# Patient Record
Sex: Male | Born: 2000 | Race: Black or African American | Hispanic: No | Marital: Single | Smoking: Never smoker
Health system: Southern US, Community
[De-identification: ages and names within clinical notes are randomized; demographics above are authoritative.]

## PROBLEM LIST (undated history)

## (undated) DIAGNOSIS — J45909 Unspecified asthma, uncomplicated: Secondary | ICD-10-CM

## (undated) DIAGNOSIS — R011 Cardiac murmur, unspecified: Secondary | ICD-10-CM

---
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
## 2021-09-23 ENCOUNTER — Encounter: Payer: Self-pay | Admitting: Emergency Medicine

## 2021-09-23 ENCOUNTER — Emergency Department: Payer: Medicaid Other

## 2021-09-23 ENCOUNTER — Emergency Department
Admission: EM | Admit: 2021-09-23 | Discharge: 2021-09-23 | Disposition: A | Discharge status: Home / Self Care | Payer: Medicaid Other | Attending: Emergency Medicine | Admitting: Emergency Medicine

## 2021-09-23 ENCOUNTER — Other Ambulatory Visit: Payer: Self-pay

## 2021-09-23 DIAGNOSIS — R0981 Nasal congestion: Secondary | ICD-10-CM | POA: Insufficient documentation

## 2021-09-23 DIAGNOSIS — Z20822 Contact with and (suspected) exposure to covid-19: Secondary | ICD-10-CM | POA: Diagnosis not present

## 2021-09-23 DIAGNOSIS — R519 Headache, unspecified: Secondary | ICD-10-CM | POA: Diagnosis present

## 2021-09-23 DIAGNOSIS — R059 Cough, unspecified: Secondary | ICD-10-CM | POA: Insufficient documentation

## 2021-09-23 DIAGNOSIS — R0989 Other specified symptoms and signs involving the circulatory and respiratory systems: Secondary | ICD-10-CM | POA: Diagnosis not present

## 2021-09-23 DIAGNOSIS — Z5321 Procedure and treatment not carried out due to patient leaving prior to being seen by health care provider: Secondary | ICD-10-CM | POA: Insufficient documentation

## 2021-09-23 LAB — RESP PANEL BY RT-PCR (FLU A&B, COVID) ARPGX2
Influenza A by PCR: NEGATIVE
Influenza B by PCR: NEGATIVE
SARS Coronavirus 2 by RT PCR: NEGATIVE

## 2021-09-23 NOTE — ED Triage Notes (Signed)
Pt arrived via [facility] from home with reports of HA, congestion and runny nose for several days.  Pt ambulatory on arrival, alert and oriented x 4.

## 2021-09-23 NOTE — ED Notes (Signed)
No answer when called from lobby 

## 2021-09-23 NOTE — ED Notes (Signed)
No answer when called several times from lobby 
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
## 2022-05-22 ENCOUNTER — Emergency Department: Payer: Medicaid Other

## 2022-05-22 ENCOUNTER — Other Ambulatory Visit: Payer: Self-pay

## 2022-05-22 ENCOUNTER — Emergency Department
Admission: EM | Admit: 2022-05-22 | Discharge: 2022-05-22 | Disposition: A | Discharge status: Home / Self Care | Payer: Medicaid Other | Attending: Emergency Medicine | Admitting: Emergency Medicine

## 2022-05-22 ENCOUNTER — Encounter: Payer: Self-pay | Admitting: Emergency Medicine

## 2022-05-22 DIAGNOSIS — J45909 Unspecified asthma, uncomplicated: Secondary | ICD-10-CM | POA: Insufficient documentation

## 2022-05-22 DIAGNOSIS — R519 Headache, unspecified: Secondary | ICD-10-CM | POA: Insufficient documentation

## 2022-05-22 DIAGNOSIS — S29001A Unspecified injury of muscle and tendon of front wall of thorax, initial encounter: Secondary | ICD-10-CM | POA: Insufficient documentation

## 2022-05-22 DIAGNOSIS — S299XXA Unspecified injury of thorax, initial encounter: Secondary | ICD-10-CM

## 2022-05-22 MED ORDER — IBUPROFEN 400 MG PO TABS
400.0000 mg | ORAL_TABLET | Freq: Once | ORAL | Status: AC
  Administered 2022-05-22: 400 mg via ORAL
  Filled 2022-05-22: qty 1
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
## 2022-06-10 DIAGNOSIS — B9689 Other specified bacterial agents as the cause of diseases classified elsewhere: Secondary | ICD-10-CM | POA: Diagnosis not present

## 2022-06-10 DIAGNOSIS — A64 Unspecified sexually transmitted disease: Secondary | ICD-10-CM | POA: Insufficient documentation

## 2022-06-10 DIAGNOSIS — K625 Hemorrhage of anus and rectum: Secondary | ICD-10-CM | POA: Diagnosis not present

## 2022-06-10 DIAGNOSIS — J45909 Unspecified asthma, uncomplicated: Secondary | ICD-10-CM | POA: Diagnosis not present

## 2022-06-10 DIAGNOSIS — N390 Urinary tract infection, site not specified: Secondary | ICD-10-CM | POA: Insufficient documentation

## 2022-06-10 DIAGNOSIS — R1084 Generalized abdominal pain: Secondary | ICD-10-CM | POA: Diagnosis present

## 2022-06-10 NOTE — ED Triage Notes (Addendum)
EMS brings pt in from home for c/o generalized abd pain x 2 wks with rectal bleeding, N/V

## 2022-06-11 ENCOUNTER — Other Ambulatory Visit: Payer: Self-pay

## 2022-06-11 ENCOUNTER — Emergency Department: Payer: Medicaid Other

## 2022-06-11 ENCOUNTER — Encounter: Payer: Self-pay | Admitting: Emergency Medicine

## 2022-06-11 ENCOUNTER — Emergency Department
Admission: EM | Admit: 2022-06-11 | Discharge: 2022-06-11 | Disposition: A | Discharge status: Home / Self Care | Payer: Medicaid Other | Attending: Emergency Medicine | Admitting: Emergency Medicine

## 2022-06-11 DIAGNOSIS — K625 Hemorrhage of anus and rectum: Secondary | ICD-10-CM

## 2022-06-11 DIAGNOSIS — N390 Urinary tract infection, site not specified: Secondary | ICD-10-CM

## 2022-06-11 DIAGNOSIS — A64 Unspecified sexually transmitted disease: Secondary | ICD-10-CM

## 2022-06-11 DIAGNOSIS — R1084 Generalized abdominal pain: Secondary | ICD-10-CM

## 2022-06-11 LAB — COMPREHENSIVE METABOLIC PANEL
ALT: 14 U/L (ref 0–44)
AST: 18 U/L (ref 15–41)
Albumin: 4.3 g/dL (ref 3.5–5.0)
Alkaline Phosphatase: 67 U/L (ref 38–126)
Anion gap: 6 (ref 5–15)
BUN: 12 mg/dL (ref 6–20)
CO2: 26 mmol/L (ref 22–32)
Calcium: 9 mg/dL (ref 8.9–10.3)
Chloride: 108 mmol/L (ref 98–111)
Creatinine, Ser: 1.07 mg/dL (ref 0.61–1.24)
GFR, Estimated: >60 mL/min (ref >60)
Glucose, Bld: 92 mg/dL (ref 70–99)
Potassium: 3.5 mmol/L (ref 3.5–5.1)
Sodium: 140 mmol/L (ref 135–145)
Total Bilirubin: 0.9 mg/dL (ref 0.3–1.2)
Total Protein: 7 g/dL (ref 6.5–8.1)

## 2022-06-11 LAB — URINALYSIS, ROUTINE W REFLEX MICROSCOPIC
Bilirubin Urine: NEGATIVE
Glucose, UA: NEGATIVE mg/dL
Hgb urine dipstick: NEGATIVE
Ketones, ur: NEGATIVE mg/dL
Nitrite: NEGATIVE
Protein, ur: NEGATIVE mg/dL
Specific Gravity, Urine: 1.023 (ref 1.005–1.030)
Squamous Epithelial / HPF: NONE SEEN (ref 0–5)
WBC, UA: >50 WBC/hpf — ABNORMAL HIGH (ref 0–5)
pH: 7 (ref 5.0–8.0)

## 2022-06-11 LAB — CBC WITH DIFFERENTIAL/PLATELET
Abs Immature Granulocytes: 0.02 10*3/uL (ref 0.00–0.07)
Basophils Absolute: 0.1 10*3/uL (ref 0.0–0.1)
Basophils Relative: 1 %
Eosinophils Absolute: 0.1 10*3/uL (ref 0.0–0.5)
Eosinophils Relative: 1 %
HCT: 43 % (ref 39.0–52.0)
Hemoglobin: 14.9 g/dL (ref 13.0–17.0)
Immature Granulocytes: 0 %
Lymphocytes Relative: 24 %
Lymphs Abs: 2.2 10*3/uL (ref 0.7–4.0)
MCH: 29.9 pg (ref 26.0–34.0)
MCHC: 34.7 g/dL (ref 30.0–36.0)
MCV: 86.3 fL (ref 80.0–100.0)
Monocytes Absolute: 0.6 10*3/uL (ref 0.1–1.0)
Monocytes Relative: 6 %
Neutro Abs: 6.4 10*3/uL (ref 1.7–7.7)
Neutrophils Relative %: 68 %
Platelets: 210 10*3/uL (ref 150–400)
RBC: 4.98 MIL/uL (ref 4.22–5.81)
RDW: 13.1 % (ref 11.5–15.5)
WBC: 9.3 10*3/uL (ref 4.0–10.5)
nRBC: 0 % (ref 0.0–0.2)

## 2022-06-11 LAB — LIPASE, BLOOD: Lipase: 36 U/L (ref 11–51)

## 2022-06-11 LAB — CHLAMYDIA/NGC RT PCR (ARMC ONLY)
Chlamydia Tr: DETECTED — AB
N gonorrhoeae: DETECTED — AB

## 2022-06-11 MED ORDER — MORPHINE SULFATE (PF) 4 MG/ML IV SOLN
4.0000 mg | Freq: Once | INTRAVENOUS | Status: AC
  Administered 2022-06-11: 4 mg via INTRAVENOUS
  Filled 2022-06-11: qty 1

## 2022-06-11 MED ORDER — SODIUM CHLORIDE 0.9 % IV SOLN
1.0000 g | Freq: Once | INTRAVENOUS | Status: AC
  Administered 2022-06-11: 1 g via INTRAVENOUS
  Filled 2022-06-11: qty 10

## 2022-06-11 MED ORDER — IOHEXOL 300 MG/ML  SOLN
100.0000 mL | Freq: Once | INTRAMUSCULAR | Status: AC | PRN
  Administered 2022-06-11: 100 mL via INTRAVENOUS

## 2022-06-11 MED ORDER — ONDANSETRON 4 MG PO TBDP: 4.0000 mg | ORAL_TABLET | Freq: Three times a day (TID) | ORAL | 0 refills | Status: DC | PRN

## 2022-06-11 MED ORDER — DICYCLOMINE HCL 20 MG PO TABS: 20.0000 mg | ORAL_TABLET | Freq: Four times a day (QID) | ORAL | 0 refills | Status: DC

## 2022-06-11 MED ORDER — ONDANSETRON HCL 4 MG/2ML IJ SOLN
4.0000 mg | Freq: Once | INTRAMUSCULAR | Status: AC
  Administered 2022-06-11: 4 mg via INTRAVENOUS
  Filled 2022-06-11: qty 2

## 2022-06-11 MED ORDER — SODIUM CHLORIDE 0.9 % IV BOLUS
1000.0000 mL | Freq: Once | INTRAVENOUS | Status: AC
  Administered 2022-06-11: 1000 mL via INTRAVENOUS

## 2022-06-11 MED ORDER — DOXYCYCLINE HYCLATE 50 MG PO CAPS: 100.0000 mg | ORAL_CAPSULE | Freq: Two times a day (BID) | ORAL | 0 refills | Status: DC

## 2022-06-11 NOTE — ED Provider Notes (Signed)
[facility] Provider Note    Event Date/Time   First MD Initiated Contact with Patient 06/11/22 0158     (approximate)   History   Abdominal Pain   HPI  James Mclaughlin is a 21 y.o. male brought to the ED via EMS from home with a chief complaint of generalized abdominal pain x2 weeks with occasional nausea/vomiting and bright red blood per rectum.  Denies fever/chills, chest pain, shortness of breath, dysuria.  Sexually active, vaginal penetration; no anal penetration per patient or personally but no concerns for STDs.  Denies recent travel, antibiotic use or trauma.  Denies personal or family history of inflammatory bowel disease.     Past Medical History   Past Medical History:  Diagnosis Date   Asthma      Active Problem List  There are no problems to display for this patient.    Past Surgical History  History reviewed. No pertinent surgical history.   Home Medications   Prior to Admission medications   Medication Sig Start Date End Date Taking? Authorizing Provider  dicyclomine (BENTYL) 20 MG tablet Take 1 tablet (20 mg total) by mouth every 6 (six) hours. 06/11/22  Yes Irean Hong, MD  doxycycline (VIBRAMYCIN) 50 MG capsule Take 2 capsules (100 mg total) by mouth 2 (two) times daily. 06/11/22  Yes Irean Hong, MD  ondansetron (ZOFRAN-ODT) 4 MG disintegrating tablet Take 1 tablet (4 mg total) by mouth every 8 (eight) hours as needed for nausea or vomiting. 06/11/22  Yes Irean Hong, MD  ipratropium (ATROVENT) 0.06 % nasal spray Place 2 sprays into both nostrils 4 (four) times daily as needed for rhinitis. 04/08/20   Tommie Sams, DO  ketorolac (TORADOL) 10 MG tablet Take 1 tablet (10 mg total) by mouth every 6 (six) hours as needed for moderate pain or severe pain. 04/08/20   Tommie Sams, DO     Allergies  Patient has no known allergies.   Family History   Family History  Problem Relation Age of Onset   Healthy Mother    Healthy  Father      Physical Exam  Triage Vital Signs: ED Triage Vitals  Enc Vitals Group     BP 06/11/22 0005 130/70     Pulse Rate 06/11/22 0005 82     Resp 06/11/22 0005 18     Temp 06/11/22 0005 98.6 F (37 C)     Temp Source 06/11/22 0005 Oral     SpO2 06/11/22 0005 94 %     Weight 06/11/22 0003 185 lb (83.9 kg)     Height 06/11/22 0003 5\' 8"  (1.727 m)     Head Circumference --      Peak Flow --      Pain Score 06/11/22 0003 8     Pain Loc --      Pain Edu? --      Excl. in GC? --     Updated Vital Signs: BP 130/70 (BP Location: Right Arm)   Pulse 82   Temp 98.6 F (37 C) (Oral)   Resp 18   Ht 5\' 8"  (1.727 m)   Wt 83.9 kg   SpO2 94%   BMI 28.13 kg/m    General: Awake, no distress.  Texting on cell phone. CV:  RRR.  Good peripheral perfusion.  Resp:  Normal effort.  CTAB. Abd:  Mild generalized tenderness to palpation without rebound or guarding.  No distention.  CVAT.  No truncal vesicles. Other:  Yellow urethral discharge.  Circumcised male.  Bilaterally distended testicles which are nontender and nonswollen.  Strong bilateral cremasteric reflexes.   ED Results / Procedures / Treatments  Labs (all labs ordered are listed, but only abnormal results are displayed) Labs Reviewed  CHLAMYDIA/NGC RT PCR ([facility] ONLY)           - Abnormal; Notable for the following components:      Result Value   Chlamydia Tr DETECTED (*)    N gonorrhoeae DETECTED (*)    All other components within normal limits  URINALYSIS, ROUTINE W REFLEX MICROSCOPIC - Abnormal; Notable for the following components:   Color, Urine YELLOW (*)    APPearance CLOUDY (*)    Leukocytes,Ua LARGE (*)    WBC, UA >50 (*)    Bacteria, UA RARE (*)    All other components within normal limits  URINE CULTURE  CBC WITH DIFFERENTIAL/PLATELET  COMPREHENSIVE METABOLIC PANEL  LIPASE, BLOOD     EKG  None   RADIOLOGY I have independently visualized and interpreted patient's CT scan as well as noted the  radiology interpretation:  CT abdomen/pelvis: No acute; right lobe liver hyperdense area  Official radiology report(s): CT Abdomen Pelvis W Contrast  Result Date: 06/11/2022 CLINICAL DATA:  Generalized abdominal pain and rectal bleeding. EXAM: CT ABDOMEN AND PELVIS WITH CONTRAST TECHNIQUE: Multidetector CT imaging of the abdomen and pelvis was performed using the standard protocol following bolus administration of intravenous contrast. RADIATION DOSE REDUCTION: This exam was performed according to the departmental dose-optimization program which includes automated exposure control, adjustment of the mA and/or kV according to patient size and/or use of iterative reconstruction technique. CONTRAST:  OMNIPAQUE IOHEXOL 300 MG/ML  SOLN COMPARISON:  None Available. FINDINGS: Lower chest: No acute abnormality. Hepatobiliary: An ill-defined, approximately 2.6 cm x 1.6 cm mildly hyperdense area is seen within the posterior aspect of the right lobe of the liver, adjacent to the gallbladder fossa. No gallstones, gallbladder wall thickening, or biliary dilatation. Pancreas: Unremarkable. No pancreatic ductal dilatation or surrounding inflammatory changes. Spleen: Normal in size without focal abnormality. Adrenals/Urinary Tract: Adrenal glands are unremarkable. Kidneys are normal, without renal calculi, focal lesion, or hydronephrosis. Bladder is unremarkable. Stomach/Bowel: Stomach is within normal limits. Appendix appears normal. No evidence of bowel wall thickening, distention, or inflammatory changes. Vascular/Lymphatic: No significant vascular findings are present. No enlarged abdominal or pelvic lymph nodes. Reproductive: Prostate is unremarkable. Other: No abdominal wall hernia or abnormality. No abdominopelvic ascites. Musculoskeletal: No acute or significant osseous findings. IMPRESSION: 1. Findings which may represent an area of focal fatty infiltration within the right lobe of the liver. Further  evaluation with nonemergent hepatic ultrasound is recommended. 2. No evidence of an acute or active process within the abdomen or pelvis. Electronically Signed   By: Aram Candela M.D.   On: 06/11/2022 02:51     PROCEDURES:  Critical Care performed: No  Procedures  Rectal: Deferred by patient  MEDICATIONS ORDERED IN ED: Medications  sodium chloride 0.9 % bolus 1,000 mL (1,000 mLs Intravenous New Bag/Given 06/11/22 0239)  ondansetron (ZOFRAN) injection 4 mg (4 mg Intravenous Given 06/11/22 0240)  morphine (PF) 4 MG/ML injection 4 mg (4 mg Intravenous Given 06/11/22 0240)  cefTRIAXone (ROCEPHIN) 1 g in sodium chloride 0.9 % 100 mL IVPB (1 g Intravenous New Bag/Given 06/11/22 0240)  iohexol (OMNIPAQUE) 300 MG/ML solution 100 mL (100 mLs Intravenous Contrast Given 06/11/22 0218)     IMPRESSION / MDM / ASSESSMENT  AND PLAN / ED COURSE  I reviewed the triage vital signs and the nursing notes.                             21 year old male presenting with abdominal pain, rectal bleeding, urethral discharge recently Differential diagnosis includes, but is not limited to, acute appendicitis, renal colic, testicular torsion, urinary tract infection/pyelonephritis, prostatitis,  epididymitis, diverticulitis, small bowel obstruction or ileus, colitis, abdominal aortic aneurysm, gastroenteritis, hernia, etc. I have personally reviewed patient's records and notes mainly the ED and urgent care visits for the past 2 years.  Patient's presentation is most consistent with acute illness / injury with system symptoms.  Laboratory results demonstrate normal WBC 9.3, normal electrolytes.  UA demonstrates large leukocytes with greater than 50 WBCs.  We will add urine culture and STI.  Obtain CT abdomen/pelvis to evaluate etiology of patient's symptoms.  Keep NPO, initiate IV fluid hydration, IV morphine for pain paired with IV Zofran to prevent nausea.  Initiate 1 g IV Rocephin to cover gonorrhea as well as  UTI.  Will reassess.  Clinical Course as of 06/11/22 0543  Caleen Essex Jun 11, 2022  3299 CT demonstrates no acute process.  Will place on doxycycline for UTI which will also cover possible chlamydia.  Discharged home Bentyl and Zofran to use as needed; patient will follow-up GI as an outpatient; he can also follow-up with GI for abnormal liver lobe seen on CT.  Strict return precautions given.  Patient verbalizes understanding agrees with plan of care. [JS]  0542 Patient is positive for both chlamydia and gonorrhea.  Encouraged to tell his sexual partner to seek treatment at the health department.  [JS]    Clinical Course User Index [JS] Irean Hong, MD     FINAL CLINICAL IMPRESSION(S) / ED DIAGNOSES   Final diagnoses:  Generalized abdominal pain  Lower urinary tract infectious disease  Rectal bleed  STD (sexually transmitted disease)     Rx / DC Orders   ED Discharge Orders          Ordered    doxycycline (VIBRAMYCIN) 50 MG capsule  2 times daily        06/11/22 0259    dicyclomine (BENTYL) 20 MG tablet  Every 6 hours        06/11/22 0259    ondansetron (ZOFRAN-ODT) 4 MG disintegrating tablet  Every 8 hours PRN        06/11/22 0259             Note:  This document was prepared using Dragon voice recognition software and may include unintentional dictation errors.   Irean Hong, MD 06/11/22 [phone]

## 2022-06-11 NOTE — Discharge Instructions (Addendum)
1.  Take antibiotic as prescribed (Doxycycline 100mg  twice daily x7 days). 2.  You may take medicines as needed for abdominal discomfort and nausea (Bentyl/Zofran #20). 3.  You tested positive for both chlamydia and gonorrhea.  Please inform your sexual partner(s) to seek treatment at the health department. 4.  Return to the ER for worsening symptoms, persistent vomiting, difficulty breathing or other concerns.

## 2022-06-12 LAB — URINE CULTURE: Culture: NO GROWTH

## 2022-06-17 ENCOUNTER — Emergency Department (HOSPITAL_COMMUNITY): Payer: Medicaid Other

## 2022-06-17 ENCOUNTER — Emergency Department (HOSPITAL_COMMUNITY)
Admission: EM | Admit: 2022-06-17 | Discharge: 2022-06-17 | Disposition: A | Discharge status: Home / Self Care | Payer: Medicaid Other | Attending: Emergency Medicine | Admitting: Emergency Medicine

## 2022-06-17 ENCOUNTER — Encounter (HOSPITAL_COMMUNITY): Payer: Self-pay | Admitting: Emergency Medicine

## 2022-06-17 ENCOUNTER — Other Ambulatory Visit: Payer: Self-pay

## 2022-06-17 DIAGNOSIS — R079 Chest pain, unspecified: Secondary | ICD-10-CM | POA: Insufficient documentation

## 2022-06-17 DIAGNOSIS — K921 Melena: Secondary | ICD-10-CM | POA: Diagnosis not present

## 2022-06-17 LAB — CBC WITH DIFFERENTIAL/PLATELET
Abs Immature Granulocytes: 0.02 10*3/uL (ref 0.00–0.07)
Basophils Absolute: 0.1 10*3/uL (ref 0.0–0.1)
Basophils Relative: 1 %
Eosinophils Absolute: 0.1 10*3/uL (ref 0.0–0.5)
Eosinophils Relative: 1 %
HCT: 48.9 % (ref 39.0–52.0)
Hemoglobin: 16.3 g/dL (ref 13.0–17.0)
Immature Granulocytes: 0 %
Lymphocytes Relative: 29 %
Lymphs Abs: 2.1 10*3/uL (ref 0.7–4.0)
MCH: 30.1 pg (ref 26.0–34.0)
MCHC: 33.3 g/dL (ref 30.0–36.0)
MCV: 90.4 fL (ref 80.0–100.0)
Monocytes Absolute: 0.4 10*3/uL (ref 0.1–1.0)
Monocytes Relative: 5 %
Neutro Abs: 4.7 10*3/uL (ref 1.7–7.7)
Neutrophils Relative %: 64 %
Platelets: 246 10*3/uL (ref 150–400)
RBC: 5.41 MIL/uL (ref 4.22–5.81)
RDW: 13.1 % (ref 11.5–15.5)
WBC: 7.3 10*3/uL (ref 4.0–10.5)
nRBC: 0 % (ref 0.0–0.2)

## 2022-06-17 LAB — COMPREHENSIVE METABOLIC PANEL
ALT: 18 U/L (ref 0–44)
AST: 18 U/L (ref 15–41)
Albumin: 4.3 g/dL (ref 3.5–5.0)
Alkaline Phosphatase: 62 U/L (ref 38–126)
Anion gap: 4 — ABNORMAL LOW (ref 5–15)
BUN: 11 mg/dL (ref 6–20)
CO2: 30 mmol/L (ref 22–32)
Calcium: 9.5 mg/dL (ref 8.9–10.3)
Chloride: 105 mmol/L (ref 98–111)
Creatinine, Ser: 1.07 mg/dL (ref 0.61–1.24)
GFR, Estimated: >60 mL/min (ref >60)
Glucose, Bld: 107 mg/dL — ABNORMAL HIGH (ref 70–99)
Potassium: 3.8 mmol/L (ref 3.5–5.1)
Sodium: 139 mmol/L (ref 135–145)
Total Bilirubin: 0.8 mg/dL (ref 0.3–1.2)
Total Protein: 7.2 g/dL (ref 6.5–8.1)

## 2022-06-17 LAB — LIPASE, BLOOD: Lipase: 44 U/L (ref 11–51)

## 2022-06-17 MED ORDER — ALBUTEROL SULFATE HFA 108 (90 BASE) MCG/ACT IN AERS
2.0000 | INHALATION_SPRAY | Freq: Once | RESPIRATORY_TRACT | Status: AC
  Administered 2022-06-17: 2 via RESPIRATORY_TRACT
  Filled 2022-06-17: qty 6.7

## 2022-06-17 MED ORDER — DEXAMETHASONE 4 MG PO TABS
10.0000 mg | ORAL_TABLET | Freq: Once | ORAL | Status: AC
  Administered 2022-06-17: 10 mg via ORAL
  Filled 2022-06-17: qty 1

## 2022-06-17 NOTE — ED Triage Notes (Signed)
  Patient comes in with mid chest pain that has been going on for 2-3 days.  Patient states the pain stays in the middle of his chest and feels like he is being stabbed.  Patient states he does vape everyday.  Also endorses bright red blood in his stool and painful BMs.  Was seen a few days ago at [facility] and diagnosed with fatty liver.  Pain 7/10, stabbing in chest.

## 2022-06-17 NOTE — ED Provider Notes (Signed)
[facility]-EMERGENCY DEPT Provider Note   CSN: [id number] Arrival date & time: 06/17/22  1930     History  Chief Complaint  Patient presents with   Chest Pain   Rectal Bleeding    James Mclaughlin is a 21 y.o. male.  Patient here with chest pain last several days.  Does vape.  May be remote history of needing to use inhalers in the past.  Nothing makes it worse or better.  Denies any fevers or chills.  Has had a cough.  No significant medical history with the family.  No early heart disease in the family.  He is also has some blood intermixed in his stool with may be some hard bowel movements.  He was seen several days ago for the same and had a CT scan of his abdomen and pelvis.  He is not on any blood thinners.  He is not had any issues in the past with this.  Nothing makes it worse or better.  Denies any abdominal pain currently.  The history is provided by the patient.       Home Medications Prior to Admission medications   Medication Sig Start Date End Date Taking? Authorizing Provider  dicyclomine (BENTYL) 20 MG tablet Take 1 tablet (20 mg total) by mouth every 6 (six) hours. 06/11/22   Irean Hong, MD  doxycycline (VIBRAMYCIN) 50 MG capsule Take 2 capsules (100 mg total) by mouth 2 (two) times daily. 06/11/22   Irean Hong, MD  ipratropium (ATROVENT) 0.06 % nasal spray Place 2 sprays into both nostrils 4 (four) times daily as needed for rhinitis. 04/08/20   Tommie Sams, DO  ketorolac (TORADOL) 10 MG tablet Take 1 tablet (10 mg total) by mouth every 6 (six) hours as needed for moderate pain or severe pain. 04/08/20   Tommie Sams, DO  ondansetron (ZOFRAN-ODT) 4 MG disintegrating tablet Take 1 tablet (4 mg total) by mouth every 8 (eight) hours as needed for nausea or vomiting. 06/11/22   Irean Hong, MD      Allergies    Patient has no known allergies.    Review of Systems   Review of Systems  Physical Exam Updated Vital Signs BP 134/68   Pulse  67   Temp 98 F (36.7 C) (Oral)   Resp 18   Ht 5\' 8"  (1.727 m)   Wt 83.9 kg   SpO2 99%   BMI 28.13 kg/m  Physical Exam Vitals and nursing note reviewed.  Constitutional:      General: He is not in acute distress.    Appearance: He is well-developed. He is not ill-appearing.  HENT:     Head: Normocephalic and atraumatic.  Eyes:     Extraocular Movements: Extraocular movements intact.     Conjunctiva/sclera: Conjunctivae normal.     Pupils: Pupils are equal, round, and reactive to light.  Cardiovascular:     Rate and Rhythm: Normal rate and regular rhythm.     Pulses:          Radial pulses are 2+ on the right side and 2+ on the left side.     Heart sounds: Normal heart sounds. No murmur heard. Pulmonary:     Effort: Pulmonary effort is normal. No respiratory distress.     Breath sounds: Normal breath sounds. No decreased breath sounds or wheezing.  Abdominal:     Palpations: Abdomen is soft.     Tenderness: There is no abdominal tenderness.  Musculoskeletal:        General: No swelling.     Cervical back: Normal range of motion and neck supple.  Skin:    General: Skin is warm and dry.     Capillary Refill: Capillary refill takes less than 2 seconds.  Neurological:     General: No focal deficit present.     Mental Status: He is alert.  Psychiatric:        Mood and Affect: Mood normal.     ED Results / Procedures / Treatments   Labs (all labs ordered are listed, but only abnormal results are displayed) Labs Reviewed  COMPREHENSIVE METABOLIC PANEL - Abnormal; Notable for the following components:      Result Value   Glucose, Bld 107 (*)    Anion gap 4 (*)    All other components within normal limits  CBC WITH DIFFERENTIAL/PLATELET  LIPASE, BLOOD    EKG EKG Interpretation  Date/Time:  Thursday June 17 2022 20:50:11 EST Ventricular Rate:  68 PR Interval:  132 QRS Duration: 76 QT Interval:  342 QTC Calculation: 364 R Axis:   91 Text  Interpretation: Sinus rhythm Borderline right axis deviation Borderline T wave abnormalities Confirmed by Virgina Norfolk ([id number]) on 06/17/2022 9:56:23 PM  Radiology DG Chest Portable 1 View  Result Date: 06/17/2022 CLINICAL DATA:  Chest pain EXAM: PORTABLE CHEST 1 VIEW COMPARISON:  05/22/2022 FINDINGS: The heart size and mediastinal contours are within normal limits. Both lungs are clear. The visualized skeletal structures are unremarkable. IMPRESSION: No active disease. Electronically Signed   By: Jasmine Pang M.D.   On: 06/17/2022 20:18    Procedures Procedures    Medications Ordered in ED Medications  dexamethasone (DECADRON) tablet 10 mg (has no administration in time range)  albuterol (VENTOLIN HFA) 108 (90 Base) MCG/ACT inhaler 2 puff (has no administration in time range)    ED Course/ Medical Decision Making/ A&P                           Medical Decision Making Amount and/or Complexity of Data Reviewed Labs: ordered. Radiology: ordered.  Risk Prescription drug management.   James Mclaughlin is here with chest pain and blood in his stool.  Normal vitals.  No fever.  As far as chest pain differential diagnosis likely reactive airway process versus viral process.  He has no significant cardiac risk factors and doubt ACS.  EKG shows sinus rhythm.  No ischemic changes per my review and interpretation of EKG.  I have no concern for ACS and overall we will get chest x-ray.  He has had a cough.  Will give Decadron and albuterol and reevaluate.  As far as blood in the stool this seems to be a chronic issue.  We will get a CBC to check his hemoglobin and compare to several days ago.  CT scan done earlier this week and per my review was unremarkable.  Nonspecific may be fatty infiltration to the liver.  However her liver enzymes are normal.  Hemoglobin is unremarkable.  Lab work overall per my review and interpretation is unremarkable.  Sounds like he is likely has some constipation and  suspect that this is the cause of blood in the stool.  However we will have him follow-up with gastroenterology in case this is more of an inflammatory bowel process.  Vital signs are normal.  His abdominal exam is benign.  Chest x-ray per my review and interpretation showed no  evidence of pneumonia or pneumothorax.  Overall suspect reactive airway process secondary to vaping.  Encouraged him to stop vaping.  He feels better with breathing treatment and Decadron.  Recommend continued albuterol as needed.  We will have him follow-up with gastroenterology for blood in his stool.  Discharged in good condition.  Understands return precautions.  This chart was dictated using voice recognition software.  Despite best efforts to proofread,  errors can occur which can change the documentation meaning.         Final Clinical Impression(s) / ED Diagnoses Final diagnoses:  Chest pain, unspecified type  Blood in stool    Rx / DC Orders ED Discharge Orders     None         Virgina Norfolk, DO 06/17/22 2202

## 2022-06-17 NOTE — Discharge Instructions (Signed)
I have treated you with a long-acting steroid to help with your chest pain.  Use 2 puffs of albuterol inhaler every 4-6 hours as needed.  Follow-up with gastroenterology team.

## 2022-07-10 ENCOUNTER — Emergency Department (HOSPITAL_COMMUNITY): Payer: Medicaid Other

## 2022-07-10 ENCOUNTER — Emergency Department (HOSPITAL_COMMUNITY)
Admission: EM | Admit: 2022-07-10 | Discharge: 2022-07-10 | Disposition: A | Discharge status: Home / Self Care | Payer: Medicaid Other | Attending: Emergency Medicine | Admitting: Emergency Medicine

## 2022-07-10 ENCOUNTER — Encounter (HOSPITAL_COMMUNITY): Payer: Self-pay

## 2022-07-10 ENCOUNTER — Other Ambulatory Visit: Payer: Self-pay

## 2022-07-10 DIAGNOSIS — W108XXA Fall (on) (from) other stairs and steps, initial encounter: Secondary | ICD-10-CM | POA: Diagnosis not present

## 2022-07-10 DIAGNOSIS — W19XXXA Unspecified fall, initial encounter: Secondary | ICD-10-CM

## 2022-07-10 DIAGNOSIS — R519 Headache, unspecified: Secondary | ICD-10-CM | POA: Diagnosis present

## 2022-07-10 DIAGNOSIS — J45909 Unspecified asthma, uncomplicated: Secondary | ICD-10-CM | POA: Diagnosis not present

## 2022-07-10 DIAGNOSIS — Y92009 Unspecified place in unspecified non-institutional (private) residence as the place of occurrence of the external cause: Secondary | ICD-10-CM | POA: Diagnosis not present

## 2022-07-10 NOTE — ED Triage Notes (Addendum)
Pt states that he fell at home and hit his head on the back door at 0250. No swelling noted or bleeding noted. Pt states that he took 2 excedrin. Pt states that he only remembers falling down the stairs and that's it.

## 2022-07-10 NOTE — Discharge Instructions (Signed)
You were seen in the emergency department today after a fall.  The CT scan of your head and neck are both normal.  If you have ongoing symptoms that may be related to a concussion, please call of our sports medicine to follow-up with the concussion clinic.  I have also provided some reading in your discharge instructions about concussion.  Please return if you have confusion, slurred speech, passing out.

## 2022-07-10 NOTE — ED Provider Notes (Signed)
James Mclaughlin is a 21 y.o. male.  With past medical history of asthma who presents to the emergency department after a fall.  Patient states that around 2 AM he was downstairs when his daughter began crying.  He states that he went upstairs to check on her and then on his way back downstairs tripped and fell down 2-3 steps.  He states that he struck his head on the door at the bottom of the stairs.  He denies loss of consciousness.  Not anticoagulated.  He states that since then he has had minor headache.  He denies neck pain, vomiting, changes to his vision, confusion.    Fall Associated symptoms include headaches.       Home Medications Prior to Admission medications   Medication Sig Start Date End Date Taking? Authorizing Provider  dicyclomine (BENTYL) 20 MG tablet Take 1 tablet (20 mg total) by mouth every 6 (six) hours. 06/11/22   Irean Hong, MD  doxycycline (VIBRAMYCIN) 50 MG capsule Take 2 capsules (100 mg total) by mouth 2 (two) times daily. 06/11/22   Irean Hong, MD  ipratropium (ATROVENT) 0.06 % nasal spray Place 2 sprays into both nostrils 4 (four) times daily as needed for rhinitis. 04/08/20   Tommie Sams, DO  ketorolac (TORADOL) 10 MG tablet Take 1 tablet (10 mg total) by mouth every 6 (six) hours as needed for moderate pain or severe pain. 04/08/20   Tommie Sams, DO  ondansetron (ZOFRAN-ODT) 4 MG disintegrating tablet Take 1 tablet (4 mg total) by mouth every 8 (eight) hours as needed for nausea or vomiting. 06/11/22   Irean Hong, MD      Allergies    Patient has no known allergies.    Review of Systems   Review of Systems  Neurological:  Positive for headaches.  All other systems reviewed and are negative.   Physical Exam Updated Vital Signs BP 130/69 (BP Location: Left Arm)    Pulse 75   Temp 98 F (36.7 C) (Oral)   Resp 16   Ht 5\' 8"  (1.727 m)   Wt 86.2 kg   SpO2 94%   BMI 28.89 kg/m  Physical Exam Vitals and nursing note reviewed.  Constitutional:      General: He is not in acute distress.    Appearance: Normal appearance. He is not ill-appearing or toxic-appearing.  HENT:     Head: Normocephalic and atraumatic.  Eyes:     General: No scleral icterus. Pulmonary:     Effort: Pulmonary effort is normal. No respiratory distress.  Musculoskeletal:     Cervical back: Normal range of motion and neck supple. No tenderness.  Skin:    General: Skin is warm and dry.     Findings: No rash.  Neurological:     General: No focal deficit present.     Mental Status: He is alert and oriented to person, place, and time. Mental status is at baseline.  Psychiatric:        Mood and Affect: Mood normal.        Behavior: Behavior normal.        Thought Content: Thought content normal.        Judgment: Judgment normal.     ED Results / Procedures / Treatments  Labs (all labs ordered are listed, but only abnormal results are displayed) Labs Reviewed - No data to display  EKG None  Radiology CT Cervical Spine Wo Contrast  Result Date: 07/10/2022 CLINICAL DATA:  21 year old male status post fall striking head on door. EXAM: CT CERVICAL SPINE WITHOUT CONTRAST TECHNIQUE: Multidetector CT imaging of the cervical spine was performed without intravenous contrast. Multiplanar CT image reconstructions were also generated. RADIATION DOSE REDUCTION: This exam was performed according to the departmental dose-optimization program which includes automated exposure control, adjustment of the mA and/or kV according to patient size and/or use of iterative reconstruction technique. COMPARISON:  Head CT today. FINDINGS: Alignment: Straightening and mild reversal of cervical lordosis. Cervicothoracic junction alignment is within normal limits. Bilateral posterior element alignment  is within normal limits. Skull base and vertebrae: Nearing skeletal maturity. Bone mineralization is within normal limits. Visualized skull base is intact. No atlanto-occipital dissociation. C1 and C2 appear intact and aligned. No osseous abnormality identified. Soft tissues and spinal canal: No prevertebral fluid or swelling. No visible canal hematoma. Elongated bilateral stylohyoid ligament calcification. Otherwise negative visible noncontrast neck soft tissues. Disc levels:  Negative. Upper chest: Negative. IMPRESSION: 1. No acute traumatic injury identified in the cervical spine. 2. 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 Electronically Signed   By: Odessa Fleming M.D.   On: 07/10/2022 07:54   CT Head Wo Contrast  Result Date: 07/10/2022 CLINICAL DATA:  21 year old male status post fall striking head on door. EXAM: CT HEAD WITHOUT CONTRAST TECHNIQUE: Contiguous axial images were obtained from the base of the skull through the vertex without intravenous contrast. RADIATION DOSE REDUCTION: This exam was performed according to the departmental dose-optimization program which includes automated exposure control, adjustment of the mA and/or kV according to patient size and/or use of iterative reconstruction technique. COMPARISON:  Head CT 12/04/2019. FINDINGS: Brain: Normal cerebral volume. No midline shift, ventriculomegaly, mass effect, evidence of mass lesion, intracranial hemorrhage or evidence of cortically based acute infarction. Gray-white matter differentiation is within normal limits throughout the brain. Vascular: No suspicious intracranial vascular hyperdensity. Skull: Intact. Sinuses/Orbits: Visualized paranasal sinuses and mastoids are stable and well aerated. Other: No orbit or scalp soft tissue injury identified. IMPRESSION: Stable and normal noncontrast CT appearance of the brain. No acute traumatic injury identified. Electronically Signed   By: Odessa Fleming M.D.    On: 07/10/2022 07:51    Procedures Procedures   Medications Ordered in ED Medications - No data to display  ED Course/ Medical Decision Making/ A&P                           Medical Decision Making Amount and/or Complexity of Data Reviewed Radiology: ordered.  Initial Impression and Ddx 21 year old male who presents after mechanical fall down around 3 steps striking his head on the door frame.  His head is atraumatic.  No obvious injuries.  There is no C-spine tenderness to palpation.  He is alert and oriented with no focal neurological deficits.  Triage ordered a CT head and C-spine. Patient PMH that increases complexity of ED encounter: Asthma  Interpretation of Diagnostics I independent reviewed and interpreted the labs as followed: Not indicated  - I independently visualized the following imaging with scope of interpretation limited to determining acute life threatening conditions related to emergency care: CT head and C-spine, which revealed no acute abnormalities  Patient Reassessment and Ultimate Disposition/Management Patient with mechanical fall.  No  indication that this was a syncope requiring further workup.  No abnormalities on his physical exam.  Advanced imaging is negative.  No further workup at this time.  We discussed having follow-up with [facility] and other concussion clinic if he has any ongoing symptoms concerning for this.  He is agreeable to that.  No indication for medication management at this time.  Discharge  The patient has been appropriately medically screened and/or stabilized in the ED. I have low suspicion for any other emergent medical condition which would require further screening, evaluation or treatment in the ED or require inpatient management. At time of discharge the patient is hemodynamically stable and in no acute distress. I have discussed work-up results and diagnosis with patient and answered all questions. Patient is agreeable  with discharge plan. We discussed strict return precautions for returning to the emergency department and they verbalized understanding.     Patient management required discussion with the following services or consulting groups:  None  Complexity of Problems Addressed Acute complicated illness or Injury  Additional Data Reviewed and Analyzed Further history obtained from: Care Everywhere  Patient Encounter Risk Assessment None  Final Clinical Impression(s) / ED Diagnoses Final diagnoses:  Fall, initial encounter    Rx / DC Orders ED Discharge Orders     None         Cristopher Peru, PA-C 07/10/22 [id number]    Bethann Berkshire, MD 07/11/22 [phone]

## 2022-09-01 ENCOUNTER — Emergency Department
Admission: EM | Admit: 2022-09-01 | Discharge: 2022-09-02 | Disposition: A | Discharge status: Other Acute Inpatient Hospital | Payer: Medicaid Other | Attending: Emergency Medicine | Admitting: Emergency Medicine

## 2022-09-01 ENCOUNTER — Other Ambulatory Visit: Payer: Self-pay

## 2022-09-01 DIAGNOSIS — R45851 Suicidal ideations: Secondary | ICD-10-CM

## 2022-09-01 DIAGNOSIS — J45909 Unspecified asthma, uncomplicated: Secondary | ICD-10-CM | POA: Insufficient documentation

## 2022-09-01 DIAGNOSIS — Z1152 Encounter for screening for COVID-19: Secondary | ICD-10-CM | POA: Diagnosis not present

## 2022-09-01 DIAGNOSIS — F322 Major depressive disorder, single episode, severe without psychotic features: Secondary | ICD-10-CM | POA: Diagnosis present

## 2022-09-01 LAB — COMPREHENSIVE METABOLIC PANEL
ALT: 50 U/L — ABNORMAL HIGH (ref 0–44)
AST: 32 U/L (ref 15–41)
Albumin: 4.2 g/dL (ref 3.5–5.0)
Alkaline Phosphatase: 58 U/L (ref 38–126)
Anion gap: 7 (ref 5–15)
BUN: 8 mg/dL (ref 6–20)
CO2: 29 mmol/L (ref 22–32)
Calcium: 9 mg/dL (ref 8.9–10.3)
Chloride: 104 mmol/L (ref 98–111)
Creatinine, Ser: 1.01 mg/dL (ref 0.61–1.24)
GFR, Estimated: >60 mL/min (ref >60)
Glucose, Bld: 88 mg/dL (ref 70–99)
Potassium: 3.9 mmol/L (ref 3.5–5.1)
Sodium: 140 mmol/L (ref 135–145)
Total Bilirubin: 1.3 mg/dL — ABNORMAL HIGH (ref 0.3–1.2)
Total Protein: 6.9 g/dL (ref 6.5–8.1)

## 2022-09-01 LAB — URINE DRUG SCREEN, QUALITATIVE (ARMC ONLY)
Amphetamines, Ur Screen: NOT DETECTED
Barbiturates, Ur Screen: NOT DETECTED
Benzodiazepine, Ur Scrn: NOT DETECTED
Cannabinoid 50 Ng, Ur ~~LOC~~: NOT DETECTED
Cocaine Metabolite,Ur ~~LOC~~: NOT DETECTED
MDMA (Ecstasy)Ur Screen: NOT DETECTED
Methadone Scn, Ur: NOT DETECTED
Opiate, Ur Screen: NOT DETECTED
Phencyclidine (PCP) Ur S: NOT DETECTED
Tricyclic, Ur Screen: NOT DETECTED

## 2022-09-01 LAB — ACETAMINOPHEN LEVEL: Acetaminophen (Tylenol), Serum: <10 ug/mL — ABNORMAL LOW (ref 10–30)

## 2022-09-01 LAB — RESP PANEL BY RT-PCR (RSV, FLU A&B, COVID)  RVPGX2
Influenza A by PCR: NEGATIVE
Influenza B by PCR: NEGATIVE
Resp Syncytial Virus by PCR: NEGATIVE
SARS Coronavirus 2 by RT PCR: NEGATIVE

## 2022-09-01 LAB — CBC
HCT: 50.8 % (ref 39.0–52.0)
Hemoglobin: 17.3 g/dL — ABNORMAL HIGH (ref 13.0–17.0)
MCH: 30 pg (ref 26.0–34.0)
MCHC: 34.1 g/dL (ref 30.0–36.0)
MCV: 88 fL (ref 80.0–100.0)
Platelets: 251 10*3/uL (ref 150–400)
RBC: 5.77 MIL/uL (ref 4.22–5.81)
RDW: 12.7 % (ref 11.5–15.5)
WBC: 8.2 10*3/uL (ref 4.0–10.5)
nRBC: 0 % (ref 0.0–0.2)

## 2022-09-01 LAB — ETHANOL: Alcohol, Ethyl (B): <10 mg/dL (ref <10)

## 2022-09-01 LAB — SALICYLATE LEVEL: Salicylate Lvl: <7 mg/dL — ABNORMAL LOW (ref 7.0–30.0)

## 2022-09-01 MED ORDER — NICOTINE 21 MG/24HR TD PT24
21.0000 mg | MEDICATED_PATCH | Freq: Once | TRANSDERMAL | Status: DC
  Administered 2022-09-01: 21 mg via TRANSDERMAL
  Filled 2022-09-01: qty 1

## 2022-09-01 NOTE — ED Notes (Signed)
Pt requests nicotine patch, secure chat to [facility]. VO placed

## 2022-09-01 NOTE — ED Provider Notes (Signed)
[facility] Provider Note    Event Date/Time   First MD Initiated Contact with Patient 09/01/22 1613     (approximate)   History   Chief Complaint SI   HPI  James Mclaughlin is a 22 y.o. male with past medical history of asthma who presents to the ED complaining of suicidal ideation.  Patient reports that he has been feeling increasingly depressed with thoughts of suicide over the past couple of days.  He reports a plan to overdose or to stab himself, but decided he wanted to seek care before doing anything.  He states he has never seen a psychiatrist or taken any medication for depression.  He admits to occasional marijuana use but denies other drug or alcohol use.  He denies any medical complaints at this time.     Physical Exam   Triage Vital Signs: ED Triage Vitals  Enc Vitals Group     BP      Pulse      Resp      Temp      Temp src      SpO2      Weight      Height      Head Circumference      Peak Flow      Pain Score      Pain Loc      Pain Edu?      Excl. in [place]?     Most recent vital signs: Vitals:   09/01/22 1617  BP: (!) 128/90  Pulse: 89  Resp: 18  Temp: 98.5 F (36.9 C)  SpO2: 99%    Constitutional: Alert and oriented. Eyes: Conjunctivae are normal. Head: Atraumatic. Nose: No congestion/rhinnorhea. Mouth/Throat: Mucous membranes are moist.  Cardiovascular: Normal rate, regular rhythm. Grossly normal heart sounds.  2+ radial pulses bilaterally. Respiratory: Normal respiratory effort.  No retractions. Lungs CTAB. Gastrointestinal: Soft and nontender. No distention. Musculoskeletal: No lower extremity tenderness nor edema.  Neurologic:  Normal speech and language. No gross focal neurologic deficits are appreciated.    ED Results / Procedures / Treatments   Labs (all labs ordered are listed, but only abnormal results are displayed) Labs Reviewed  COMPREHENSIVE METABOLIC PANEL - Abnormal; Notable for the following  components:      Result Value   ALT 50 (*)    Total Bilirubin 1.3 (*)    All other components within normal limits  SALICYLATE LEVEL - Abnormal; Notable for the following components:   Salicylate Lvl <3.2 (*)    All other components within normal limits  ACETAMINOPHEN LEVEL - Abnormal; Notable for the following components:   Acetaminophen (Tylenol), Serum <10 (*)    All other components within normal limits  CBC - Abnormal; Notable for the following components:   Hemoglobin 17.3 (*)    All other components within normal limits  RESP PANEL BY RT-PCR (RSV, FLU A&B, COVID)  [id number]  ETHANOL  URINE DRUG SCREEN, QUALITATIVE ([facility] ONLY)     PROCEDURES:  Critical Care performed: No  Procedures   MEDICATIONS ORDERED IN ED: Medications - No data to display   IMPRESSION / MDM / [place] / ED COURSE  I reviewed the triage vital signs and the nursing notes.                              22 y.o. male with past medical history of asthma who presents to  the ED with depression and increasing suicidal ideation over the past couple of days.  Patient's presentation is most consistent with acute presentation with potential threat to life or bodily function.  Differential diagnosis includes, but is not limited to, depression, anxiety, psychosis, suicidal ideation, substance abuse.  Patient nontoxic-appearing and in no acute distress, vital signs are unremarkable.  He denies any medical complaints and screening labs are unremarkable with no significant anemia, leukocytosis, electrolyte abnormality, or AKI.  Tylenol and salicylate levels are undetectable and patient may be medically cleared for psychiatric disposition.  He is calm and cooperative, desiring of treatment and we will hold off on IVC.  The patient has been placed in psychiatric observation due to the need to provide a safe environment for the patient while obtaining psychiatric consultation and evaluation, as well as  ongoing medical and medication management to treat the patient's condition.  The patient has not been placed under full IVC at this time.      FINAL CLINICAL IMPRESSION(S) / ED DIAGNOSES   Final diagnoses:  Suicidal ideation     Rx / DC Orders   ED Discharge Orders     None        Note:  This document was prepared using Dragon voice recognition software and may include unintentional dictation errors.   Blake Divine, MD 09/01/22 1740

## 2022-09-01 NOTE — ED Notes (Signed)
Hospital meal provided, pt tolerated w/o complaints.  Waste discarded appropriately.  

## 2022-09-01 NOTE — Consult Note (Addendum)
[facility] Face-to-Face Psychiatry Consult   Reason for Consult:  Suicidal, grief Referring Physician:  EDP Patient Identification: James Mclaughlin MRN:  [id number] Principal Diagnosis: Major depressive disorder, single episode, severe without psychotic features (HCC) Diagnosis:  Principal Problem:   Major depressive disorder, single episode, severe without psychotic features (HCC)   Total Time spent with patient: 45 minutes  Subjective: "I am having suicidal thoughts." James Mclaughlin is a 22 y.o. male with recent thoughts of suicide, including hanging, stabbing, and overdose.   HPI: Per Dr. Larinda Buttery,  James Mclaughlin is a 22 y.o. male with recent thoughts of suicide, including hanging, stabbing, and overdose. Patient reports recent loss of aunt and grandmother, which is significantly impacted his mental state.  Last suicidal thought occurred last night. He reports daily marijuana use, which he states previously helped but now seeks professional help.  On evaluation, patient is calm, cooperative,  maintained good eye contact and engaged easily in conversation. The patient reports active suicidal ideation; he denies a plan while in the hospital but states if he were not in the hospital he would drive his car off the road or jump off of a bridge. He reports the trigger for his suicidal thoughts is the passing of his grandmother on his 22st birthday.  He reports multiple past suicide attempts with the most recent being last night in which he tried to hang himself, but states his sister came in after work and found him. He endorses homicidal ideation; he reports thoughts of wanting to harm everyone in the house. Patient states, "if I miss grandma, I know everybody do too." Patient endorses occasional auditory hallucinations, he reports hearing voices encouraging him to harm himself.  He reports a history of visual hallucinations, seeing his grandmother and angels, but states he has not experienced this recently.  Appetite and sleeping habits are reported to be poor. He reports sleep disturbances in the context of having difficulty falling asleep and staying asleep. He denies experiencing bad dreams or nightmares.    Collateral obtained from mother, [name]: [name] reports patient has been expressing suicidal ideation to her, stating that he feels he would be "better off dead".  She reports he has been crying out frequently, expressing feelings of despair and questioning his self worth.  He has also expressed feelings of abandonment, questioning why his father was not present in his life.  Mother reports she has been unable to provide answers to these questions, which seems to contribute to his distress.  Mother reports patient has expressed feelings of being a burden to her and disappointment in himself, stating, "I let you down."  She reports no known past attempts of self-harm or suicide, to her knowledge.  Patient's mother reports she has been supportive, trying to reassure him that she is doing her best is a mother.  Mother reports patient is currently employed at [employer].  Patient's mother informed of the recommendation for psychiatric inpatient hospitalization, with an estimated stay of 5 to 7 days. Mother informed the location of his admission is yet to be determined, but she will be up updated on the location.  Recommend psychiatric Inpatient admission when medically cleared. Reviewed with Dr. Larinda Buttery.   Past Psychiatric History: Patient reported no prior psychiatric hospitalizations or depression medication.  No history of grief counseling.  Patient reports marijuana use.  Denies use of other substances.  Risk to Self:  Yes Risk to Others:  None Prior Inpatient Therapy:  None Prior Outpatient Therapy:  None  Past Medical  History:  Past Medical History:  Diagnosis Date   Asthma    No past surgical history on file. Family History:  Family History  Problem Relation Age of Onset   Healthy Mother     Healthy Father    Family Psychiatric  History: Patient reported mother has a history of depression and anxiety, and is on medication.  Social History:  Social History   Substance and Sexual Activity  Alcohol Use Never     Social History   Substance and Sexual Activity  Drug Use Never    Social History   Socioeconomic History   Marital status: Single    Spouse name: Not on file   Number of children: Not on file   Years of education: Not on file   Highest education level: Not on file  Occupational History   Not on file  Tobacco Use   Smoking status: Never   Smokeless tobacco: Never  Vaping Use   Vaping Use: Every day  Substance and Sexual Activity   Alcohol use: Never   Drug use: Never   Sexual activity: Yes  Other Topics Concern   Not on file  Social History Narrative   Not on file   Social Determinants of Health   Financial Resource Strain: Not on file  Food Insecurity: Not on file  Transportation Needs: Not on file  Physical Activity: Not on file  Stress: Not on file  Social Connections: Not on file   Additional Social History:    Allergies:  No Known Allergies  Labs:  Results for orders placed or performed during the hospital encounter of 09/01/22 (from the past 48 hour(s))  cbc     Status: Abnormal   Collection Time: 09/01/22  4:16 PM  Result Value Ref Range   WBC 8.2 4.0 - 10.5 K/uL   RBC 5.77 4.22 - 5.81 MIL/uL   Hemoglobin 17.3 (H) 13.0 - 17.0 g/dL   HCT 50.8 39.0 - 52.0 %   MCV 88.0 80.0 - 100.0 fL   MCH 30.0 26.0 - 34.0 pg   MCHC 34.1 30.0 - 36.0 g/dL   RDW 12.7 11.5 - 15.5 %   Platelets 251 150 - 400 K/uL   nRBC 0.0 0.0 - 0.2 %    Comment: Performed at [facility], [address] [place], [place] [address]    No current facility-administered medications for this encounter.   Current Outpatient Medications  Medication Sig Dispense Refill   dicyclomine (BENTYL) 20 MG tablet Take 1 tablet (20 mg total) by mouth every 6 (six)  hours. 20 tablet 0   doxycycline (VIBRAMYCIN) 50 MG capsule Take 2 capsules (100 mg total) by mouth 2 (two) times daily. 28 capsule 0   ipratropium (ATROVENT) 0.06 % nasal spray Place 2 sprays into both nostrils 4 (four) times daily as needed for rhinitis. 15 mL 0   ketorolac (TORADOL) 10 MG tablet Take 1 tablet (10 mg total) by mouth every 6 (six) hours as needed for moderate pain or severe pain. 20 tablet 0   ondansetron (ZOFRAN-ODT) 4 MG disintegrating tablet Take 1 tablet (4 mg total) by mouth every 8 (eight) hours as needed for nausea or vomiting. 20 tablet 0    Musculoskeletal: Strength & Muscle Tone: within normal limits Gait & Station: normal Patient leans: N/A  Psychiatric Specialty Exam: Physical Exam Vitals and nursing note reviewed.  Constitutional:      Appearance: Normal appearance.  HENT:     Head: Normocephalic.  Nose: Nose normal.  Pulmonary:     Effort: Pulmonary effort is normal.  Musculoskeletal:        General: Normal range of motion.     Cervical back: Normal range of motion.  Neurological:     General: No focal deficit present.     Mental Status: He is alert and oriented to person, place, and time.  Psychiatric:        Attention and Perception: Attention and perception normal.        Mood and Affect: Mood is anxious and depressed.        Speech: Speech normal.        Behavior: Behavior normal. Behavior is cooperative.        Thought Content: Thought content includes suicidal ideation. Thought content includes suicidal plan.        Cognition and Memory: Cognition and memory normal.        Judgment: Judgment normal.    Review of Systems  Psychiatric/Behavioral:  Positive for depression and suicidal ideas. The patient is nervous/anxious.   All other systems reviewed and are negative.   Blood pressure (!) 128/90, pulse 89, temperature 98.5 F (36.9 C), resp. rate 18, SpO2 99 %.There is no height or weight on file to calculate BMI.  General Appearance:  Casual  Eye Contact:  Good  Speech:  Clear and Coherent  Volume:  Normal  Mood:  Depressed and Worthless  Affect:  Congruent and Depressed  Thought Process:  Coherent  Orientation:  Full (Time, Place, and Person)  Thought Content:  WDL  Suicidal Thoughts:  Yes.  with intent/plan  Homicidal Thoughts:  No  Memory:  Immediate;   Good  Judgement:  Fair  Insight:  Fair  Psychomotor Activity:  Normal  Concentration:  Concentration: Good  Recall:  Good  Fund of Knowledge:  Good  Language:  Good  Akathisia:  No  Handed:  Right  AIMS (if indicated):     Assets:  Communication Skills Desire for Improvement Financial Resources/Insurance Housing Resilience Social Support  ADL's:  Intact  Cognition:  WNL  Sleep:   Poor      Physical Exam: Physical Exam Vitals and nursing note reviewed.  Constitutional:      Appearance: Normal appearance.  HENT:     Head: Normocephalic.     Nose: Nose normal.  Pulmonary:     Effort: Pulmonary effort is normal.  Musculoskeletal:        General: Normal range of motion.     Cervical back: Normal range of motion.  Neurological:     General: No focal deficit present.     Mental Status: He is alert and oriented to person, place, and time.  Psychiatric:        Attention and Perception: Attention and perception normal.        Mood and Affect: Mood is anxious and depressed.        Speech: Speech normal.        Behavior: Behavior normal. Behavior is cooperative.        Thought Content: Thought content includes suicidal ideation. Thought content includes suicidal plan.        Cognition and Memory: Cognition and memory normal.        Judgment: Judgment normal.   Review of Systems  Psychiatric/Behavioral:  Positive for depression and suicidal ideas. The patient is nervous/anxious.   All other systems reviewed and are negative.  Blood pressure (!) 128/90, pulse 89, temperature 98.5 F (36.9 C),  resp. rate 18, SpO2 99 %. There is no height or weight  on file to calculate BMI.  Treatment Plan Summary: Daily contact with patient to assess and evaluate symptoms and progress in treatment and Medication management Major depressive disorder, single episode, severe without psychosis: Admit to inpatient psychiatric unit for stabilization  Disposition: Recommend psychiatric Inpatient admission when medically cleared. Reviewed with Dr. Charna Archer.   Waylan Boga, NP 09/01/2022 4:54 PM

## 2022-09-01 NOTE — ED Triage Notes (Signed)
Pt comes with c/o SI thought. Pt states recent loss of aunt and grandmother. Pt states he has thought over hanging himself, stabbing himself or overdosing. Pt's mother with him at this time.  Pt uses marijuana and usually that helps to clear his mind. Pt stats he now wants to get help the right way.   Pt never dx with anything.

## 2022-09-01 NOTE — ED Notes (Signed)
Pt dressed out in to hospital attire. Pt belongings to include: 1 white tank 1 red hoodie 2 black slides 2 black socks 1 blue jeans 1 black phone mom taking home 2 airpod white mom taking home 1 airpod case mom taking home 1 blue and pink charger mom taking home 1 wallet given to mom taking home 1 black underwear

## 2022-09-01 NOTE — ED Notes (Signed)
Snack and drink given

## 2022-09-01 NOTE — ED Notes (Signed)
NP at the bedside for pt evaluation   

## 2022-09-02 ENCOUNTER — Inpatient Hospital Stay (HOSPITAL_COMMUNITY)
Admission: AD | Admit: 2022-09-02 | Discharge: 2022-09-06 | DRG: 882 | Disposition: A | Discharge status: Home / Self Care | Payer: Medicaid Other | Source: Intra-hospital | Attending: Psychiatry | Admitting: Psychiatry

## 2022-09-02 ENCOUNTER — Encounter (HOSPITAL_COMMUNITY): Payer: Self-pay | Admitting: Psychiatry

## 2022-09-02 DIAGNOSIS — F17200 Nicotine dependence, unspecified, uncomplicated: Secondary | ICD-10-CM | POA: Diagnosis present

## 2022-09-02 DIAGNOSIS — F909 Attention-deficit hyperactivity disorder, unspecified type: Secondary | ICD-10-CM | POA: Diagnosis present

## 2022-09-02 DIAGNOSIS — F17290 Nicotine dependence, other tobacco product, uncomplicated: Secondary | ICD-10-CM | POA: Diagnosis present

## 2022-09-02 DIAGNOSIS — R45851 Suicidal ideations: Secondary | ICD-10-CM | POA: Diagnosis present

## 2022-09-02 DIAGNOSIS — F4310 Post-traumatic stress disorder, unspecified: Principal | ICD-10-CM | POA: Diagnosis present

## 2022-09-02 DIAGNOSIS — X58XXXA Exposure to other specified factors, initial encounter: Secondary | ICD-10-CM | POA: Diagnosis present

## 2022-09-02 DIAGNOSIS — Z1152 Encounter for screening for COVID-19: Secondary | ICD-10-CM | POA: Diagnosis not present

## 2022-09-02 DIAGNOSIS — Z818 Family history of other mental and behavioral disorders: Secondary | ICD-10-CM

## 2022-09-02 DIAGNOSIS — J45909 Unspecified asthma, uncomplicated: Secondary | ICD-10-CM | POA: Diagnosis present

## 2022-09-02 DIAGNOSIS — F329 Major depressive disorder, single episode, unspecified: Secondary | ICD-10-CM | POA: Diagnosis present

## 2022-09-02 DIAGNOSIS — Z79899 Other long term (current) drug therapy: Secondary | ICD-10-CM | POA: Diagnosis not present

## 2022-09-02 DIAGNOSIS — S060XAA Concussion with loss of consciousness status unknown, initial encounter: Secondary | ICD-10-CM | POA: Diagnosis present

## 2022-09-02 MED ORDER — DOXYCYCLINE HYCLATE 50 MG PO CAPS
50.0000 mg | ORAL_CAPSULE | Freq: Two times a day (BID) | ORAL | Status: DC
  Filled 2022-09-02 (×4): qty 1

## 2022-09-02 MED ORDER — SERTRALINE HCL 25 MG PO TABS
25.0000 mg | ORAL_TABLET | Freq: Every day | ORAL | Status: AC
  Administered 2022-09-02: 25 mg via ORAL
  Filled 2022-09-02 (×2): qty 1

## 2022-09-02 MED ORDER — TRAZODONE HCL 50 MG PO TABS
50.0000 mg | ORAL_TABLET | Freq: Every evening | ORAL | Status: DC | PRN
  Administered 2022-09-02 – 2022-09-04 (×3): 50 mg via ORAL
  Filled 2022-09-02 (×3): qty 1

## 2022-09-02 MED ORDER — DICYCLOMINE HCL 20 MG PO TABS
20.0000 mg | ORAL_TABLET | Freq: Four times a day (QID) | ORAL | Status: DC
  Filled 2022-09-02 (×11): qty 1

## 2022-09-02 MED ORDER — ALBUTEROL SULFATE HFA 108 (90 BASE) MCG/ACT IN AERS: 1.0000 | INHALATION_SPRAY | Freq: Four times a day (QID) | RESPIRATORY_TRACT | Status: DC | PRN

## 2022-09-02 MED ORDER — IPRATROPIUM BROMIDE 0.06 % NA SOLN: 2.0000 | Freq: Four times a day (QID) | NASAL | Status: DC | PRN

## 2022-09-02 MED ORDER — ACETAMINOPHEN 325 MG PO TABS: 650.0000 mg | ORAL_TABLET | Freq: Four times a day (QID) | ORAL | Status: DC | PRN

## 2022-09-02 MED ORDER — SERTRALINE HCL 50 MG PO TABS
50.0000 mg | ORAL_TABLET | Freq: Every day | ORAL | Status: DC
  Administered 2022-09-03 – 2022-09-05 (×3): 50 mg via ORAL
  Filled 2022-09-02 (×6): qty 1

## 2022-09-02 MED ORDER — NICOTINE 21 MG/24HR TD PT24
21.0000 mg | MEDICATED_PATCH | Freq: Every day | TRANSDERMAL | Status: DC
  Administered 2022-09-02: 21 mg via TRANSDERMAL
  Filled 2022-09-02 (×4): qty 1

## 2022-09-02 MED ORDER — HYDROXYZINE HCL 25 MG PO TABS
25.0000 mg | ORAL_TABLET | Freq: Three times a day (TID) | ORAL | Status: DC | PRN
  Administered 2022-09-02 – 2022-09-06 (×6): 25 mg via ORAL
  Filled 2022-09-02 (×6): qty 1

## 2022-09-02 MED ORDER — NICOTINE POLACRILEX 2 MG MT GUM
2.0000 mg | CHEWING_GUM | OROMUCOSAL | Status: DC | PRN
  Administered 2022-09-02 – 2022-09-03 (×3): 2 mg via ORAL

## 2022-09-02 MED ORDER — ONDANSETRON 4 MG PO TBDP: 4.0000 mg | ORAL_TABLET | Freq: Three times a day (TID) | ORAL | Status: DC | PRN

## 2022-09-02 MED ORDER — ALUM & MAG HYDROXIDE-SIMETH 200-200-20 MG/5ML PO SUSP: 30.0000 mL | ORAL | Status: DC | PRN

## 2022-09-02 MED ORDER — MAGNESIUM HYDROXIDE 400 MG/5ML PO SUSP: 30.0000 mL | Freq: Every day | ORAL | Status: DC | PRN

## 2022-09-02 NOTE — Group Note (Signed)
Date:  09/02/2022 Time:  4:29 PM  Group Topic/Focus:  Wellness Toolbox:   The focus of this group is to discuss various aspects of wellness, balancing those aspects and exploring ways to increase the ability to experience wellness.  Patients will create a wellness toolbox for use upon discharge.    Participation Level:  Active  Participation Quality:  Appropriate  Affect:  Appropriate  Cognitive:  Appropriate  Insight: Appropriate  Engagement in Group:  Engaged  Modes of Intervention:  Exploration  Additional Comments:  Patient explored way to use self-soothing to help him calm himself when upset.    [name] 09/02/2022, 4:29 PM

## 2022-09-02 NOTE — Progress Notes (Signed)
Admission Note: report received from [facility] RN patient is a AA VOL 22 year old male from [place] [facility] Pt was calm and cooperative during assessment. Denies SI/HI/AVH. Admission plan of care reviewed with pt, consent signed.  Personal belongings/skin assessment completed.   No contraband found.  Patient oriented to the unit, staff and room.  Routine safety checks initiated.  Verbalizes understanding of unit rules/protocols.   Patient is presently safe on the unit. No unsafe behaviors noted.  Q 15 minute safety checks maintained per unit protocol.

## 2022-09-02 NOTE — ED Notes (Signed)
called to safe transport for transport to [facility] /rep:[email or web address]

## 2022-09-02 NOTE — Progress Notes (Signed)
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 unit milieu and in scheduled group therapies   -- Short Term Goals: Ability to identify changes in lifestyle to reduce recurrence of condition will improve, Ability to verbalize feelings will improve, Ability to disclose and discuss suicidal ideas, and Ability to identify and develop effective coping behaviors will improve  -- Long Term Goals: Improvement in symptoms so as ready for discharge -- Patient is encouraged to participate in group therapy while admitted to the psychiatric unit. -- We will address other chronic and acute stressors, which contributed to the patient's MDD (major depressive disorder) in order to reduce  the risk of self-harm at discharge.  5. Discharge Planning:   -- Social work and case management to assist with discharge planning and identification of hospital follow-up needs prior to discharge  -- Estimated LOS: 5-7 days  -- Discharge Concerns: Need to establish a safety plan; Medication compliance and effectiveness  -- Discharge Goals: Return home with outpatient referrals for mental health follow-up including medication management/psychotherapy  I certify that inpatient services furnished can reasonably be expected to improve the patient's condition.    I discussed my assessment, planned testing and intervention for the patient with Dr. Sherron FlemingsMassengill who agrees with my formulated course of action.  Ezzard FlaxHaejung Yoon, Medical Student 2/1/20242:56 PM   ______________________________________ I was present for the entirety of the evaluation on 09/02/2022. I reviewed the patient's chart, and I participated in key portions of the service. I discussed the case with the medical student, and I agree with the assessment and plan of care as documented in the medical student's note.   Princess BruinsJulie Taahir Grisby, DO, PGY-2

## 2022-09-02 NOTE — [facility] Group Notes (Signed)
Adult Psychoeducational Group Note  Date:  09/02/2022 Time:  8:55 PM  Group Topic/Focus:  Wrap-Up Group:   The focus of this group is to help patients review their daily goal of treatment and discuss progress on daily workbooks.  Participation Level:  Active  Participation Quality:  Attentive  Affect:  Appropriate  Cognitive:  Appropriate  Insight: Appropriate  Engagement in Group:  Engaged  Modes of Intervention:  Discussion  Additional Comments:  Patient attended the wrap-up group.  Annie Sable 09/02/2022, 8:55 PM

## 2022-09-02 NOTE — Progress Notes (Signed)
Patient alert and oriented. Calm and cooperative with staff. Social with peers in the milieu and dayroom. Denies pain. Denies SI/HI/AVH. Reports that he just needed someone to talk to. Appetite is good. No distress noted.

## 2022-09-02 NOTE — ED Notes (Signed)
Contacted pts mother per request to update on care. Mother notified.

## 2022-09-02 NOTE — [facility] Suicide Risk Assessment (Signed)
[facility] INPATIENT:  Family/Significant Other Suicide Prevention Education  Suicide Prevention Education:  Education Completed; 09-02-22, Nelly Rout [phone] (Mother)  has been identified by the patient as the family member/ Nelly Rout [phone] (Mother)  with whom the patient will be residing, and identified as the person(s) who will aid the patient in the event of a mental health crisis (suicidal ideations/suicide attempt).  With written consent from the patient, the family member/significant other has been provided the following suicide prevention education, prior to the and/or following the discharge of the patient.  The suicide prevention education provided includes the following: Suicide risk factors Suicide prevention and interventions National Suicide Hotline telephone number [facility] assessment telephone number [facility] Emergency Assistance [place] and/or Residential Mobile Crisis Unit telephone number  Request made of family/significant other to: Remove weapons (e.g., guns, rifles, knives), all items previously/currently identified as safety concern.   Remove drugs/medications (over-the-counter, prescriptions, illicit drugs), all items previously/currently identified as a safety concern.   Nelly Rout [phone] (Mother)  verbalizes understanding of the suicide prevention education information provided.  The family member/significant other agrees to remove the items of safety concern listed above.  James Mclaughlin 09/02/2022, 1:34 PM

## 2022-09-02 NOTE — [facility] Counselor (Signed)
Adult Comprehensive Assessment  Patient ID: James Mclaughlin, male   DOB: [birth year], 22 y.o.   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 bankruptcy): Pt reports no stressors at this time Housing / Lack of housing: Pt reports living with his mother and 3 of his siblings Physical health (include injuries & life threatening diseases): Pt reports no stressors Social relationships: Pt reports having few social supports Substance abuse: Pt reports using Marijuana 3 times a week and Alcohol occasionally Bereavement / Loss: Pt reports no stressors  Living/Environment/Situation:  Living Arrangements: Parent, Other relatives Living conditions (as described by patient or guardian): House/[place] Who else lives in the home?: Mother and 3 of his siblings How long has patient lived in current situation?: 2 months What is atmosphere in current home: Comfortable, Supportive  Family History:  Marital status: Long term relationship Long term relationship, how long?: 1 month What types of issues is patient dealing with in the relationship?: None reported Are you sexually active?: Yes What is your sexual orientation?: Heterosexual Has your sexual activity been affected by drugs, alcohol, medication, or emotional stress?: No Does patient have children?: No  Childhood History:  By whom was/is the patient raised?: Mother Description of patient's relationship with caregiver when they were a child: "We had a very good  relationship" Patient's description of current relationship with people who raised him/her: "We still get along good.  It is perfect now" How were you disciplined when you got in trouble as a child/adolescent?: Spankings and Groundings Does patient have siblings?: Yes Number of Siblings: 5 Description of patient's current relationship with siblings: "We all get along very well" Did patient suffer any verbal/emotional/physical/sexual abuse as a child?: No Did patient suffer from severe childhood neglect?: No Has patient ever been sexually abused/assaulted/raped as an adolescent or adult?: No Was the patient ever a victim of a crime or a disaster?: No Witnessed domestic violence?: No Has patient been affected by domestic violence as an adult?: No  Education:  Highest grade of school patient has completed: 12th grade Currently a student?: No Learning disability?: No  Employment/Work Situation:   Employment Situation: Employed Where is Patient Currently Employed?: [employer] has Patient Been Employed?: 2 months Are You Satisfied With Your Job?: Yes Do You Work More Than One Job?: No Work Stressors: None reported Patient's Job has Been Impacted by Current Illness: No What is the Longest Time Patient has Held a Job?: 2 years Where was the Patient Employed at that Time?: [employer] Has Patient ever Been in the [employer]?: No  Financial Resources:   Financial resources: Income from employment, Support from parents / caregiver, Medicaid Does patient have a representative payee or guardian?: No  Alcohol/Substance Abuse:   What has been your use of drugs/alcohol within the last 12 months?: The Pt reports using Marijuana 3 times a week and Alcohol occasionally If attempted suicide, did drugs/alcohol play a role in this?: No Alcohol/Substance Abuse Treatment Hx: Denies past history Has alcohol/substance abuse ever caused legal problems?: No  Social Support System:   Patient's  Community Support System: Good Describe  Community Support System: Mother, Siblings and Girlfriend Type of faith/religion: Darrick Meigs How does patient's faith help to cope with current illness?: I pray/[employer]ra[employer]ER  Leisure/Recreation:   Do You Have Hobbies?: Yes Leisure and Hobbies: work, Control and instrumentation engineer games and spending time with my girlfriend  Strengths/Needs:   What is the patient's perception of their strengths?: "Don't Give up, outgoing "I came to get help, so I can keep going" Patient states these barriers may affect/interfere with their treatment: none reported Patient states these barriers may affect their return to the community: pt denies Other important information patient would like considered in planning for their treatment: pt would like to be provided with outpatient resources  Discharge Plan:   Currently receiving community mental health services: No Patient states concerns and preferences for aftercare planning are: Pt would like to receive services in [place], [place], close to where he resides Patient states they will know when they are safe and ready for discharge when: "I feel ready now". Does patient have access to transportation?: Yes (Second car and my girlfriend) Does patient have financial barriers related to discharge medications?: No Patient description of barriers related to discharge medications: none reported  Summary/Recommendations:   Summary and Recommendations (to be completed by the evaluator): patient is a 22 year old [place] American male, who presents with  suicidal ideation; but denies a plan  but states if he were not in the hospital he would drive his car off the road or jump off of a bridge. He reports the trigger for his suicidal thoughts is the passing of his grandmother on his 52st birthday on  1[birth year]. pt reports multiple past suicide attempts with the most recent being "a few days ago" when he tried to hang himself, but states his sister came in after work and found  him. He endorses homicidal ideation; he reports thoughts of wanting to harm everyone in the house. Patient states, "if I miss grandma, I know everybody do too." Patient endorses occasional auditory hallucinations, he reports hearing voices encouraging him to harm himself.  He reports a history of visual hallucinations, seeing his grandmother and angels, but states he has not experienced this recently. Appetite and sleeping habits are reported to be poor. He reports sleep disturbances in the context of having difficulty falling asleep and staying asleep.  Darleen Crocker. 09/02/2022

## 2022-09-02 NOTE — Progress Notes (Signed)
   09/02/22 1027  Psych Admission Type (Psych Patients Only)  Admission Status Voluntary  Psychosocial Assessment  Patient Complaints Depression;Worrying;Nervousness  Eye Contact Fair  Facial Expression Anxious  Affect Appropriate to circumstance  Speech Logical/coherent  Interaction Minimal;Assertive  Motor Activity Other (Comment) (WNL)  Appearance/Hygiene Improved  Behavior Characteristics Cooperative  Mood Pleasant  Aggressive Behavior  Effect No apparent injury  Thought Process  Coherency WDL  Content WDL  Delusions None reported or observed  Perception WDL  Hallucination None reported or observed  Judgment WDL  Confusion None  Danger to Self  Current suicidal ideation? Denies  Danger to Others  Danger to Others None reported or observed

## 2022-09-03 ENCOUNTER — Encounter (HOSPITAL_COMMUNITY): Payer: Self-pay

## 2022-09-03 DIAGNOSIS — F17200 Nicotine dependence, unspecified, uncomplicated: Secondary | ICD-10-CM | POA: Diagnosis present

## 2022-09-03 LAB — LIPID PANEL
Cholesterol: 183 mg/dL (ref 0–200)
HDL: 67 mg/dL (ref >40)
LDL Cholesterol: 106 mg/dL — ABNORMAL HIGH (ref 0–99)
Total CHOL/HDL Ratio: 2.7 RATIO
Triglycerides: 49 mg/dL (ref <150)
VLDL: 10 mg/dL (ref 0–40)

## 2022-09-03 LAB — HEMOGLOBIN A1C
Hgb A1c MFr Bld: 5 % (ref 4.8–5.6)
Mean Plasma Glucose: 96.8 mg/dL

## 2022-09-03 LAB — TSH: TSH: 2.053 u[IU]/mL (ref 0.350–4.500)

## 2022-09-03 MED ORDER — LORAZEPAM 1 MG PO TABS: 1.0000 mg | ORAL_TABLET | ORAL | Status: DC | PRN

## 2022-09-03 MED ORDER — NICOTINE POLACRILEX 2 MG MT GUM
4.0000 mg | CHEWING_GUM | OROMUCOSAL | Status: DC | PRN
  Administered 2022-09-03 – 2022-09-05 (×7): 4 mg via ORAL
  Filled 2022-09-03 (×2): qty 2

## 2022-09-03 MED ORDER — NICOTINE 21 MG/24HR TD PT24
21.0000 mg | MEDICATED_PATCH | Freq: Every day | TRANSDERMAL | Status: DC
  Filled 2022-09-03 (×5): qty 1

## 2022-09-03 MED ORDER — OLANZAPINE 5 MG PO TBDP: 5.0000 mg | ORAL_TABLET | Freq: Three times a day (TID) | ORAL | Status: DC | PRN

## 2022-09-03 MED ORDER — ZIPRASIDONE MESYLATE 20 MG IM SOLR: 20.0000 mg | INTRAMUSCULAR | Status: DC | PRN

## 2022-09-03 NOTE — Progress Notes (Addendum)
[facility] MD Progress Note  09/03/2022 4:08 PM James Mclaughlin  MRN:  [id number]   Reason for Admission:  James Mclaughlin is a 22 y.o. male with a history of ADHD and asthma, who was initially admitted for inpatient psychiatric hospitalization on 09/02/2022 for management of SI in the setting of trauma that occurred 2 years ago for which he did not receive psychiatric evaluation.The patient is currently on Hospital Day 1.   Chart Review from last 24 hours:  The patient's chart was reviewed and nursing notes were reviewed. The patient's case was discussed in multidisciplinary team meeting. Per [facility], patient was taking medications appropriately. Per nursing, patient is calm and cooperative and attended 1 group. Patient received the following PRN medications:  Atarax 25 mg x2 Nicorette x3 Trazadone 50 mg x1  Information Obtained Today During Patient Interview: The patient was seen and evaluated on the unit. On assessment today the patient reports "feeling good." He feels anxious about when he can leave. He attended a group session yesterday which he found helpful. He denies SI, HI, AVH. He had some fatigue and nausea after taking his first dose of sertraline yesterday however denies any side effects today.   Second encounter:  Spoke with patient with attending physician present as well as patient's mother on the phone to clarify incongruencies in patient's history. When clarifying the events leading up to patient's ED visit, patient's mother James Mclaughlin reports that patient was in a good mood and his usual self until she received a text saying that he's going to the ED for suicidal thoughts. She denies knowing about his ED visit the week prior to this admission stating he was accompanying a friend and not for patient's psychiatric evaluation. She states the last time she remembers him being sad was when he called her crying last year feeling like he is a burden to her and asking why his father is not more present. She also  expressed concerns that patient may be doing this to seek attention. Patient became frustrated by his mother's recounting of her side of the story. After the conversation, he states "I'm feeling good, I'm ready to go home. My mom is ready for me to come home." When asked about his girlfriend's number for further history, he did not know it but states he will obtain it to help gather further information.   Collateral from 09-02-22: Hessie Knows (Mother) [phone] [facility])  Mom stated that patient did not do CPR on grandmother. Mom reported that the patient did not go to the ED last week for himself, rather he was accompanying a male friend to the ED because she had a stomach bug. On wednesday, the day he presented [facility] ED, mom stated that patient was in a good mood, being with friends and sister. Appeared to be at baseline. Then later that evening patient texted mom, saying that he was suicidal, and no further information. Mom felt that patient is with-holding how patient really feels and his sxs.  Patient would ball up at night and cry at times because patient felt that he was a burden to mom.  Mom reported that she does not have any safety concerns, leave the patient with not have killed himself despite SI.  Mom is unsure of what prompted the SI.  Mom reported that she believes that he has depression, however does not think it is so severe that he would harm himself.  Rather because he endorsed SI, mom wanted to take that thought seriously.  Principal Problem: PTSD (post-traumatic stress disorder) Diagnosis: Principal Problem:   PTSD (post-traumatic stress disorder) Active Problems:   Tobacco use disorder  Past Psychiatric History: None  Past Medical History:  Past Medical History:  Diagnosis Date   Asthma    History reviewed. No pertinent surgical history. Family History:  Family History  Problem Relation Age of Onset   Healthy Mother    Healthy Father    Family  Psychiatric  History:  Maternal grandmother - history of EtOH abuse   Social History:  Place [place] Abuse[place] None Marital Status: Single Children: Step son 21.21 years old Employment: [employer] Education: Graduated high school Housing: Lives with mother, two sisters, and brother Finances: N/A Legal: None Military: N/A Weapons: No access to firearm  Pills stockpile: None  Current Medications: Current Facility-Administered Medications  Medication Dose Route Frequency Provider Last Rate Last Admin   acetaminophen (TYLENOL) tablet 650 mg  650 mg Oral Q6H PRN Sindy GuadeloupeWilliams, Roy, NP       albuterol (VENTOLIN HFA) 108 (90 Base) MCG/ACT inhaler 1-2 puff  1-2 puff Inhalation Q6H PRN Amere Bricco, Harrold DonathNathan, MD       alum & mag hydroxide-simeth (MAALOX/MYLANTA) 200-200-20 MG/5ML suspension 30 mL  30 mL Oral Q4H PRN Sindy GuadeloupeWilliams, Roy, NP       hydrOXYzine (ATARAX) tablet 25 mg  25 mg Oral TID PRN Phineas InchesMassengill, Cydne Grahn, MD   25 mg at 09/03/22 1321   ipratropium (ATROVENT) 0.06 % nasal spray 2 spray  2 spray Nasal QID PRN Sindy GuadeloupeWilliams, Roy, NP       OLANZapine zydis (ZYPREXA) disintegrating tablet 5 mg  5 mg Oral Q8H PRN Princess BruinsNguyen, Julie, DO       And   LORazepam (ATIVAN) tablet 1 mg  1 mg Oral PRN Princess BruinsNguyen, Julie, DO       And   ziprasidone (GEODON) injection 20 mg  20 mg Intramuscular PRN Princess BruinsNguyen, Julie, DO       magnesium hydroxide (MILK OF MAGNESIA) suspension 30 mL  30 mL Oral Daily PRN Sindy GuadeloupeWilliams, Roy, NP       nicotine (NICODERM CQ - dosed in mg/24 hours) patch 21 mg  21 mg Transdermal Daily Princess BruinsNguyen, Julie, DO       nicotine polacrilex (NICORETTE) gum 4 mg  4 mg Oral PRN Princess BruinsNguyen, Julie, DO   4 mg at 09/03/22 1513   ondansetron (ZOFRAN-ODT) disintegrating tablet 4 mg  4 mg Oral Q8H PRN Sindy GuadeloupeWilliams, Roy, NP       sertraline (ZOLOFT) tablet 50 mg  50 mg Oral Daily Jennifer Payes, Harrold DonathNathan, MD   50 mg at 09/03/22 0750   traZODone (DESYREL) tablet 50 mg  50 mg Oral QHS PRN Phineas InchesMassengill, Azriel Dancy, MD   50 mg at  09/02/22 2122    Lab Results:  Results for orders placed or performed during the hospital encounter of 09/01/22 (from the past 48 hour(s))  Comprehensive metabolic panel     Status: Abnormal   Collection Time: 09/01/22  4:16 PM  Result Value Ref Range   Sodium 140 135 - 145 mmol/L   Potassium 3.9 3.5 - 5.1 mmol/L   Chloride 104 98 - 111 mmol/L   CO2 29 22 - 32 mmol/L   Glucose, Bld 88 70 - 99 mg/dL    Comment: Glucose reference range applies only to samples taken after fasting for at least 8 hours.   BUN 8 6 - 20 mg/dL   Creatinine, Ser 4.091.01 0.61 - 1.24 mg/dL  Calcium 9.0 8.9 - 10.3 mg/dL   Total Protein 6.9 6.5 - 8.1 g/dL   Albumin 4.2 3.5 - 5.0 g/dL   AST 32 15 - 41 U/L   ALT 50 (H) 0 - 44 U/L   Alkaline Phosphatase 58 38 - 126 U/L   Total Bilirubin 1.3 (H) 0.3 - 1.2 mg/dL   GFR, Estimated >60 >60 mL/min    Comment: (NOTE) Calculated using the CKD-EPI Creatinine Equation (2021)    Anion gap 7 5 - 15    Comment: Performed at [facility], [address] [place], [place] [address]  Ethanol     Status: None   Collection Time: 09/01/22  4:16 PM  Result Value Ref Range   Alcohol, Ethyl (B) <10 <10 mg/dL    Comment: (NOTE) Lowest detectable limit for serum alcohol is 10 mg/dL.  For medical purposes only. Performed at [facility], [address] [place], [place] [address]   Salicylate level     Status: Abnormal   Collection Time: 09/01/22  4:16 PM  Result Value Ref Range   Salicylate Lvl <5.3 (L) 7.0 - 30.0 mg/dL    Comment: Performed at [facility], [address] [place], [place] [address]  Acetaminophen level     Status: Abnormal   Collection Time: 09/01/22  4:16 PM  Result Value Ref Range   Acetaminophen (Tylenol), Serum <10 (L) 10 - 30 ug/mL    Comment: (NOTE) Therapeutic concentrations vary significantly. A range of 10-30 ug/mL  may be an effective concentration for many patients. However, some  are best treated at  concentrations outside of this range. Acetaminophen concentrations >150 ug/mL at 4 hours after ingestion  and >50 ug/mL at 12 hours after ingestion are often associated with  toxic reactions.  Performed at [facility], [address] [place], [place] [address]   cbc     Status: Abnormal   Collection Time: 09/01/22  4:16 PM  Result Value Ref Range   WBC 8.2 4.0 - 10.5 K/uL   RBC 5.77 4.22 - 5.81 MIL/uL   Hemoglobin 17.3 (H) 13.0 - 17.0 g/dL   HCT 50.8 39.0 - 52.0 %   MCV 88.0 80.0 - 100.0 fL   MCH 30.0 26.0 - 34.0 pg   MCHC 34.1 30.0 - 36.0 g/dL   RDW 12.7 11.5 - 15.5 %   Platelets 251 150 - 400 K/uL   nRBC 0.0 0.0 - 0.2 %    Comment: Performed at [facility], [address] [place], [place] [address]  Urine Drug Screen, Qualitative     Status: None   Collection Time: 09/01/22  4:16 PM  Result Value Ref Range   Tricyclic, Ur Screen NONE DETECTED NONE DETECTED   Amphetamines, Ur Screen NONE DETECTED NONE DETECTED   MDMA (Ecstasy)Ur Screen NONE DETECTED NONE DETECTED   Cocaine Metabolite,Ur [place] NONE DETECTED NONE DETECTED   Opiate, Ur Screen NONE DETECTED NONE DETECTED   Phencyclidine (PCP) Ur S NONE DETECTED NONE DETECTED   Cannabinoid 50 Ng, Ur [place] NONE DETECTED NONE DETECTED   Barbiturates, Ur Screen NONE DETECTED NONE DETECTED   Benzodiazepine, Ur Scrn NONE DETECTED NONE DETECTED   Methadone Scn, Ur NONE DETECTED NONE DETECTED    Comment: (NOTE) Tricyclics + metabolites, urine    Cutoff 1000 ng/mL Amphetamines + metabolites, urine  Cutoff 1000 ng/mL MDMA (Ecstasy), urine              Cutoff 500 ng/mL Cocaine Metabolite, urine  Cutoff 300 ng/mL Opiate + metabolites, urine        Cutoff 300 ng/mL Phencyclidine (PCP), urine         Cutoff 25 ng/mL Cannabinoid, urine                 Cutoff 50 ng/mL Barbiturates + metabolites, urine  Cutoff 200 ng/mL Benzodiazepine, urine              Cutoff 200 ng/mL Methadone, urine                   Cutoff 300  ng/mL  The urine drug screen provides only a preliminary, unconfirmed analytical test result and should not be used for non-medical purposes. Clinical consideration and professional judgment should be applied to any positive drug screen result due to possible interfering substances. A more specific alternate chemical method must be used in order to obtain a confirmed analytical result. Gas chromatography / mass spectrometry (GC/MS) is the preferred confirm atory method. Performed at [facility], [address] [place], [place] [address]   Resp panel by RT-PCR (RSV, Flu A&B, Covid) Anterior Nasal Swab     Status: None   Collection Time: 09/01/22  4:18 PM   Specimen: Anterior Nasal Swab  Result Value Ref Range   SARS Coronavirus 2 by RT PCR NEGATIVE NEGATIVE    Comment: (NOTE) SARS-CoV-2 target nucleic acids are NOT DETECTED.  The SARS-CoV-2 RNA is generally detectable in upper respiratory specimens during the acute phase of infection. The lowest concentration of SARS-CoV-2 viral copies this assay can detect is 138 copies/mL. A negative result does not preclude SARS-Cov-2 infection and should not be used as the sole basis for treatment or other patient management decisions. A negative result may occur with  improper specimen collection/handling, submission of specimen other than nasopharyngeal swab, presence of viral mutation(s) within the areas targeted by this assay, and inadequate number of viral copies(<138 copies/mL). A negative result must be combined with clinical observations, patient history, and epidemiological information. The expected result is Negative.  Fact Sheet for Patients:  BloggerCourse.com  Fact Sheet for Healthcare Providers:  SeriousBroker.it  This test is no t yet approved or cleared by the [place] FDA and  has been authorized for detection and/or diagnosis of SARS-CoV-2 by FDA under an  Emergency Use Authorization (EUA). This EUA will remain  in effect (meaning this test can be used) for the duration of the COVID-19 declaration under Section 564(b)(1) of the Act, 21 U.S.C.section 360bbb-3(b)(1), unless the authorization is terminated  or revoked sooner.       Influenza A by PCR NEGATIVE NEGATIVE   Influenza B by PCR NEGATIVE NEGATIVE    Comment: (NOTE) The Xpert Xpress SARS-CoV-2/FLU/RSV plus assay is intended as an aid in the diagnosis of influenza from Nasopharyngeal swab specimens and should not be used as a sole basis for treatment. Nasal washings and aspirates are unacceptable for Xpert Xpress SARS-CoV-2/FLU/RSV testing.  Fact Sheet for Patients: BloggerCourse.com  Fact Sheet for Healthcare Providers: SeriousBroker.it  This test is not yet approved or cleared by the [place] FDA and has been authorized for detection and/or diagnosis of SARS-CoV-2 by FDA under an Emergency Use Authorization (EUA). This EUA will remain in effect (meaning this test can be used) for the duration of the COVID-19 declaration under Section 564(b)(1) of the Act, 21 U.S.C. section 360bbb-3(b)(1), unless the authorization is terminated or revoked.     Resp Syncytial Virus by PCR NEGATIVE NEGATIVE  Comment: (NOTE) Fact Sheet for Patients: BloggerCourse.com  Fact Sheet for Healthcare Providers: SeriousBroker.it  This test is not yet approved or cleared by the [place] FDA and has been authorized for detection and/or diagnosis of SARS-CoV-2 by FDA under an Emergency Use Authorization (EUA). This EUA will remain in effect (meaning this test can be used) for the duration of the COVID-19 declaration under Section 564(b)(1) of the Act, 21 U.S.C. section 360bbb-3(b)(1), unless the authorization is terminated or revoked.  Performed at [facility], [address] [place], [place] [address]     Blood Alcohol level:  Lab Results  Component Value Date   [facility] <10 09/01/2022    Metabolic Disorder Labs: No results found for: "HGBA1C", "MPG" No results found for: "PROLACTIN" No results found for: "CHOL", "TRIG", "HDL", "CHOLHDL", "VLDL", "LDLCALC"  Physical Findings: AIMS:  , ,  ,  ,     Musculoskeletal: Strength & Muscle Tone: within normal limits Gait & Station: normal Patient leans: N/A  Psychiatric Specialty Exam:  General Appearance: appears at stated age, casually dressed and groomed   Behavior: pleasant and cooperative however mildly anxious   Psychomotor Activity: no psychomotor agitation or retardation noted   Eye Contact: fair Speech: normal amount, tone, volume and fluency   Mood: euthymic Affect: congruent, pleasant and interactive  Thought Process: linear, goal directed, no circumstantial or tangential thought process noted, no racing thoughts or flight of ideas Descriptions of Associations: intact Thought Content: no bizarre content, logical and future-oriented Hallucinations: denies AH, VH , does not appear responding to stimuli Delusions: no paranoia, delusions of control, grandeur, ideas of reference, thought broadcasting, and magical thinking Suicidal Thoughts: denies SI, intention, plan  Homicidal Thoughts: denies HI, intention, plan   Alertness/Orientation: alert and fully oriented  Insight: limited Judgment: limited  Memory: intact  Executive Functions  Concentration: intact  Attention Span: fair Recall: intact Fund of Knowledge: fair  Agricultural engineer; Desire for Improvement; Financial Resources/Insurance; Housing; Social Support; Vocational/Educational    Physical Exam: Constitutional:      Appearance: Normal appearance.  Cardiovascular:     Rate and Rhythm: Normal rate.  Pulmonary:     Effort: Pulmonary effort is normal.  Neurological:     General: No focal deficit  present.     Mental Status: Alert and oriented to person, place, and time.    Review of Systems  Constitutional: Negative.  Negative for chills, fever and weight loss.  HENT: Negative.    Eyes: Negative.   Respiratory: Negative.    Cardiovascular: Negative.   Gastrointestinal:  Negative for constipation, diarrhea, nausea and vomiting.  Genitourinary: Negative.   Musculoskeletal: Negative.   Skin: Negative.   Neurological: Negative.  Negative for tingling.   ASSESSMENT:  Diagnoses / Active Problems: Principal Problem: PTSD (post-traumatic stress disorder) Diagnosis: Principal Problem:   PTSD (post-traumatic stress disorder) Active Problems:   Tobacco use disorder   PLAN: Safety and Monitoring:  -- Voluntary admission to inpatient psychiatric unit for safety, stabilization and treatment  -- Daily contact with patient to assess and evaluate symptoms and progress in treatment  -- Patient's case to be discussed in multi-disciplinary team meeting  -- Observation Level : q15 minute checks  -- Vital signs:  q12 hours  -- Precautions: suicide, elopement, and assault  -- STARTED Agitation PRNs  2. Psychiatric Diagnosis and treatment PTSD vs MDD  -- Continued Zoloft 50 mg daily  --Will obtain further collateral information from patient's girlfriend. Discussed with patient the  importance of having a consistent history before beginning to make further plans for discharge.  -- The risks/benefits/side-effects/alternatives to this medication were discussed in detail with the patient and time was given for questions. The patient consents to medication trial.   -- Order Metabolic profile including lipid panel, A1c, TSH, and EKG monitoring  nicotine, 21 mg, Daily sertraline, 50 mg, Daily    PRN Medications acetaminophen, 650 mg, Q6H PRN albuterol, 1-2 puff, Q6H PRN alum & mag hydroxide-simeth, 30 mL, Q4H PRN hydrOXYzine, 25 mg, TID PRN ipratropium, 2 spray, QID PRN OLANZapine zydis,  5 mg, Q8H PRN  And LORazepam, 1 mg, PRN  And ziprasidone, 20 mg, PRN magnesium hydroxide, 30 mL, Daily PRN nicotine polacrilex, 4 mg, PRN ondansetron, 4 mg, Q8H PRN traZODone, 50 mg, QHS PRN   3. Pertinent labs:  UDS negative Ethanol, salicylate, and acetaminophen negative  CMP: AST 32, ALT 50, total Bili 1.3 CBC: Hg 17.3     Lab ordered: Lipid panel, A1c, TSH, EKG   4. Tobacco Use Disorder  -- Continued Nicotine patch 21mg /24 hours  -INCREASED nicotine gum from 2 mg to 4 mg PRN due to intense craving  -- Smoking cessation encouraged  5. Group and Therapy: -- Encouraged patient to participate in unit milieu and in scheduled group therapies   -- Short Term Goals: Ability to identify changes in lifestyle to reduce recurrence of condition will improve, Ability to verbalize feelings will improve, Ability to disclose and discuss suicidal ideas, Ability to identify and develop effective coping behaviors will improve, and Ability to maintain clinical measurements within normal limits will improve  -- Long Term Goals: Improvement in symptoms so as ready for discharge  6. Discharge Planning:   -- Social work and case management to assist with discharge planning and identification of hospital follow-up needs prior to discharge  -- Estimated LOS: 5-7 days  -- Discharge Concerns: Need to establish a safety plan; Medication compliance and effectiveness  -- Discharge Goals: Return home with outpatient referrals for mental health follow-up including medication management/psychotherapy   Clydell Hakim, Medical Student 09/03/2022, 4:08 PM   I was present for the entirety of the evaluation on 09/03/2022. I reviewed the patient's chart, and I participated in key portions of the service. I discussed the case with the medical student and attending, and I agree with the assessment and plan of care as documented in the medical student's note.   [name], DO PGY2  Total Time Spent in Direct Patient  Care:  I personally spent 35 minutes on the unit in direct patient care. The direct patient care time included face-to-face time with the patient, reviewing the patient's chart, communicating with other professionals, and coordinating care. Greater than 50% of this time was spent in counseling or coordinating care with the patient regarding goals of hospitalization, psycho-education, and discharge planning needs.  I personally was present and performed or re-performed the history, physical exam and medical decision-making activities of this service and have verified that the service and findings are accurately documented in the student's note, , as addended by me or notated below:  I interviewed the patient today.  The symptoms he was reporting, the story he was reporting leading up to this admission about faking suicidal thoughts in order to be admitted, so that he could receive quicker access to outpatient psychiatric services, but is contradicted by what the mother said during a phone call today.  Mother was on speakerphone, patient provided the number to his mother from  his own memory, provided these riders, and gave verbal consent for the phone call to occur.  Patient is unable to account for the inconsistencies between his own reporting of the HPI, and that that his mother provides to our team.  Nonetheless he reports the Zoloft is not causing any side effects, he is taking the medication.  He does Zoloft be increased to 50 mg once daily today.  There is concern for more severe depressive symptoms and the patient initially reported to these riders of the day of admission.  Nonetheless we will continue the medication for both depressive symptoms and also PTSD, and monitor response to these medications.  Over the weekend, if collateral can be obtained from the patient's girlfriend, whom he told our team that he told he was going to the emergency department, to fake having suicidal thoughts, to be  admitted, to gain access to psychiatric services more quickly.  Nonetheless, he denies having any SI or HI today.  [name], MD Psychiatrist

## 2022-09-03 NOTE — [facility] Group Notes (Signed)
Pt attended AA meeting this evening.  

## 2022-09-03 NOTE — Progress Notes (Signed)
    09/03/22 0553  15 Minute Checks  Location Bedroom  Visual Appearance Calm  Behavior Sleeping  Sleep (Behavioral Health Patients Only)  Calculate sleep? (Click Yes once per 24 hr at 0600 safety check) Yes  Documented sleep last 24 hours 8.25

## 2022-09-03 NOTE — Progress Notes (Signed)
   09/02/22 2122  Psych Admission Type (Psych Patients Only)  Admission Status Voluntary  Psychosocial Assessment  Patient Complaints Worrying  Eye Contact Fair  Facial Expression Anxious  Affect Appropriate to circumstance  Speech Logical/coherent  Interaction Assertive  Motor Activity Other (Comment) (WDL)  Appearance/Hygiene Improved  Behavior Characteristics Cooperative;Appropriate to situation  Mood Pleasant  Thought Process  Coherency WDL  Content WDL  Delusions None reported or observed  Perception WDL  Hallucination None reported or observed  Judgment WDL  Confusion None  Danger to Self  Current suicidal ideation? Denies  Danger to Others  Danger to Others None reported or observed
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
## 2022-09-03 NOTE — Progress Notes (Signed)
  Administered PRN Hydroxyzine and Trazodone per MAR per patient request. 

## 2022-09-03 NOTE — Progress Notes (Signed)
   09/03/22 0855  Psych Admission Type (Psych Patients Only)  Admission Status Voluntary  Psychosocial Assessment  Patient Complaints Worrying  Eye Contact Fair  Facial Expression Anxious  Affect Appropriate to circumstance  Speech Logical/coherent  Interaction Assertive  Motor Activity Other (Comment) (WNL)  Appearance/Hygiene Improved  Behavior Characteristics Cooperative;Appropriate to situation  Mood Pleasant  Aggressive Behavior  Effect No apparent injury  Thought Process  Coherency WDL  Content WDL  Delusions None reported or observed  Perception WDL  Hallucination None reported or observed  Judgment WDL  Confusion None  Danger to Self  Current suicidal ideation? Denies  Danger to Others  Danger to Others None reported or observed    

## 2022-09-03 NOTE — [facility] Suicide Risk Assessment (Signed)
[facility] Admission Suicide Risk Assessment  Nursing information obtained from:   Demographic factors: Male Current Mental Status: NA Loss Factors: NA Historical Factors: NA Risk Reduction Factors: Positive social support, Positive therapeutic relationship, Positive coping skills or problem solving skills, Employed, Sense of responsibility to family  Principal Problem: PTSD (post-traumatic stress disorder) Diagnosis: Principal Problem:   PTSD (post-traumatic stress disorder) Active Problems:   Tobacco use disorder   Subjective Data:  James Mclaughlin is a 22 y.o., male with past medical hsistory significant for ADHD and asthma who presents to [facility] voluntarily from [facility] ED for evaluation and management of suicidal ideation. Last evening patient presented to the ED stating he had thoughts of suicide and plan.   Continued Clinical Symptoms:  Alcohol Use Disorder Identification Test Final Score (AUDIT): 1 The "Alcohol Use Disorders Identification Test", Guidelines for Use in Primary Care, Second Edition.  World Pharmacologist [facility]). Score between 0-7:  no or low risk or alcohol related problems. Score between 8-15:  moderate risk of alcohol related problems. Score between 16-19:  high risk of alcohol related problems. Score 20 or above:  warrants further diagnostic evaluation for alcohol dependence and treatment.  CLINICAL FACTORS:  Depression:   Anhedonia Impulsivity Severe Unstable or Poor Therapeutic Relationship  Musculoskeletal: Strength & Muscle Tone: within normal limits Gait & Station: normal Patient leans: N/A   Presentation  General Appearance: appears at stated age, casually dressed in jeans and hospital scrubs and well groomed    Behavior: pleasant and cooperative    Psychomotor Activity: no psychomotor agitation or retardation noted    Eye Contact: fair  Speech: normal amount, tone, volume and fluency      Mood: euthymic  Affect:  congruent, pleasant and interactive    Thought Process: linear, goal directed, no circumstantial or tangential thought process noted, no racing thoughts or flight of ideas  Descriptions of Associations: intact    Thought Content Hallucinations: denies AH, VH , does not appear responding to stimuli  Delusions: no paranoia, delusions of control, grandeur, ideas of reference, thought broadcasting, and magical thinking  Suicidal Thoughts: denies SI, intention, plan  Homicidal Thoughts: denies HI, intention, plan    Alertness/Orientation: alert and fully oriented    Insight: limited Judgment: intact    Memory: intact    Executive Functions  Concentration: intact  Attention Span: intact Recall: intact  Fund of Knowledge: fair    Physical Exam  Constitutional:      Appearance: Normal appearance.  Cardiovascular:     Rate and Rhythm: Normal rate.  Pulmonary:     Effort: Pulmonary effort is normal.  Neurological:     General: No focal deficit present.     Mental Status: Alert and oriented to person, place, and time.    COGNITIVE FEATURES THAT CONTRIBUTE TO RISK:  Loss of executive function, Polarized thinking, and Thought constriction (tunnel vision)    SUICIDE RISK:  Severe:  Frequent, intense, and enduring suicidal ideation, specific plan, no subjective intent, but some objective markers of intent (i.e., choice of lethal method), the method is accessible, some limited preparatory behavior, evidence of impaired self-control, severe dysphoria/symptomatology, multiple risk factors present, and few if any protective factors, particularly a lack of social support.   PLAN OF CARE:  Patient met requirement for inpatient psychiatric hospitalization and has been admitted.  See H&P & attending's attestation for detailed plan.  I certify that inpatient services furnished can reasonably be expected to improve the patient's condition.  Total Time  spent with patient:  See attending  attestation  Signed: [name], DO Psychiatry Resident, PGY-2 [facility] 09/02/2022, 4:55 PM

## 2022-09-03 NOTE — Group Note (Signed)
Date:  09/03/2022 Time:  10:35 AM  Group Topic/Focus:  Orientation:   The focus of this group is to educate the patient on the purpose and policies of crisis stabilization and provide a format to answer questions about their admission.  The group details unit policies and expectations of patients while admitted.    Participation Level:  Active  Participation Quality:  Appropriate  Affect:  Appropriate  Cognitive:  Appropriate  Insight: Appropriate  Engagement in Group:  Engaged  Modes of Intervention:  Discussion  Additional Comments:     [name] 09/03/2022, 10:35 AM

## 2022-09-03 NOTE — Plan of Care (Signed)
  Problem: Activity: Goal: Interest or engagement in activities will improve Outcome: Progressing   

## 2022-09-03 NOTE — BH IP Treatment Plan (Signed)
Interdisciplinary Treatment and Diagnostic Plan Update  09/03/2022 Time of Session: James Mclaughlin MRN: [id number]  Principal Diagnosis: PTSD (post-traumatic stress disorder)  Secondary Diagnoses: Principal Problem:   PTSD (post-traumatic stress disorder)   Current Medications:  Current Facility-Administered Medications  Medication Dose Route Frequency Provider Last Rate Last Admin   acetaminophen (TYLENOL) tablet 650 mg  650 mg Oral Q6H PRN [name], NP       albuterol (VENTOLIN HFA) 108 (90 Base) MCG/ACT inhaler 1-2 puff  1-2 puff Inhalation Q6H PRN [name], [name], MD       alum & mag hydroxide-simeth (MAALOX/MYLANTA) 200-200-20 MG/5ML suspension 30 mL  30 mL Oral Q4H PRN [name], NP       hydrOXYzine (ATARAX) tablet 25 mg  25 mg Oral TID PRN [name], MD   25 mg at 09/03/22 1321   ipratropium (ATROVENT) 0.06 % nasal spray 2 spray  2 spray Nasal QID PRN [name], NP       OLANZapine zydis (ZYPREXA) disintegrating tablet 5 mg  5 mg Oral Q8H PRN [name], DO       And   LORazepam (ATIVAN) tablet 1 mg  1 mg Oral PRN [name], DO       And   ziprasidone (GEODON) injection 20 mg  20 mg Intramuscular PRN [name], DO       magnesium hydroxide (MILK OF MAGNESIA) suspension 30 mL  30 mL Oral Daily PRN [name], NP       nicotine (NICODERM CQ - dosed in mg/24 hours) patch 21 mg  21 mg Transdermal Daily [name], DO       nicotine polacrilex (NICORETTE) gum 4 mg  4 mg Oral PRN [name], DO       ondansetron (ZOFRAN-ODT) disintegrating tablet 4 mg  4 mg Oral Q8H PRN [name], NP       sertraline (ZOLOFT) tablet 50 mg  50 mg Oral Daily [name], [name], MD   50 mg at 09/03/22 0750   traZODone (DESYREL) tablet 50 mg  50 mg Oral QHS PRN [name], MD   50 mg at 09/02/22 2122   PTA Medications: Medications Prior to Admission  Medication Sig Dispense Refill Last Dose   dicyclomine (BENTYL) 20 MG tablet Take 1 tablet (20  mg total) by mouth every 6 (six) hours. (Patient not taking: Reported on 09/01/2022) 20 tablet 0    doxycycline (VIBRAMYCIN) 50 MG capsule Take 2 capsules (100 mg total) by mouth 2 (two) times daily. (Patient not taking: Reported on 09/01/2022) 28 capsule 0    ipratropium (ATROVENT) 0.06 % nasal spray Place 2 sprays into both nostrils 4 (four) times daily as needed for rhinitis. (Patient not taking: Reported on 09/01/2022) 15 mL 0    ketorolac (TORADOL) 10 MG tablet Take 1 tablet (10 mg total) by mouth every 6 (six) hours as needed for moderate pain or severe pain. (Patient not taking: Reported on 09/01/2022) 20 tablet 0    ondansetron (ZOFRAN-ODT) 4 MG disintegrating tablet Take 1 tablet (4 mg total) by mouth every 8 (eight) hours as needed for nausea or vomiting. (Patient not taking: Reported on 09/01/2022) 20 tablet 0     Patient Stressors:    Patient Strengths:    Treatment Modalities: Medication Management, Group therapy, Case management,  1 to 1 session with clinician, Psychoeducation, Recreational therapy.   Physician Treatment Plan for Primary Diagnosis: PTSD (post-traumatic stress disorder) Long Term Goal(s): Improvement in symptoms so as ready for  discharge   Short Term Goals: Ability to identify changes in lifestyle to reduce recurrence of condition will improve Ability to verbalize feelings will improve Ability to disclose and discuss suicidal ideas Ability to identify and develop effective coping behaviors will improve Ability to maintain clinical measurements within normal limits will improve  Medication Management: Evaluate patient's response, side effects, and tolerance of medication regimen.  Therapeutic Interventions: 1 to 1 sessions, Unit Group sessions and Medication administration.  Evaluation of Outcomes: Progressing  Physician Treatment Plan for Secondary Diagnosis: Principal Problem:   PTSD (post-traumatic stress disorder)  Long Term Goal(s): Improvement in  symptoms so as ready for discharge   Short Term Goals: Ability to identify changes in lifestyle to reduce recurrence of condition will improve Ability to verbalize feelings will improve Ability to disclose and discuss suicidal ideas Ability to identify and develop effective coping behaviors will improve Ability to maintain clinical measurements within normal limits will improve     Medication Management: Evaluate patient's response, side effects, and tolerance of medication regimen.  Therapeutic Interventions: 1 to 1 sessions, Unit Group sessions and Medication administration.  Evaluation of Outcomes: Progressing   RN Treatment Plan for Primary Diagnosis: PTSD (post-traumatic stress disorder) Long Term Goal(s): Knowledge of disease and therapeutic regimen to maintain health will improve  Short Term Goals: Ability to remain free from injury will improve, Ability to verbalize frustration and anger appropriately will improve, Ability to demonstrate self-control, Ability to participate in decision making will improve, Ability to verbalize feelings will improve, Ability to disclose and discuss suicidal ideas, Ability to identify and develop effective coping behaviors will improve, and Compliance with prescribed medications will improve  Medication Management: RN will administer medications as ordered by provider, will assess and evaluate patient's response and provide education to patient for prescribed medication. RN will report any adverse and/or side effects to prescribing provider.  Therapeutic Interventions: 1 on 1 counseling sessions, Psychoeducation, Medication administration, Evaluate responses to treatment, Monitor vital signs and CBGs as ordered, Perform/monitor CIWA, COWS, AIMS and Fall Risk screenings as ordered, Perform wound care treatments as ordered.  Evaluation of Outcomes: Progressing   LCSW Treatment Plan for Primary Diagnosis: PTSD (post-traumatic stress disorder) Long Term  Goal(s): Safe transition to appropriate next level of care at discharge, Engage patient in therapeutic group addressing interpersonal concerns.  Short Term Goals: Engage patient in aftercare planning with referrals and resources, Increase social support, Increase ability to appropriately verbalize feelings, Increase emotional regulation, Facilitate acceptance of mental health diagnosis and concerns, Facilitate patient progression through stages of change regarding substance use diagnoses and concerns, Identify triggers associated with mental health/substance abuse issues, and Increase skills for wellness and recovery  Therapeutic Interventions: Assess for all discharge needs, 1 to 1 time with Social worker, Explore available resources and support systems, Assess for adequacy in community support network, Educate family and significant other(s) on suicide prevention, Complete Psychosocial Assessment, Interpersonal group therapy.  Evaluation of Outcomes: Progressing   Progress in Treatment: Attending groups: Yes. Participating in groups: Yes. Taking medication as prescribed: Yes. Toleration medication: Yes. Family/Significant other contact made: Yes, individual(s) contacted:  Nelly Rout [phone] (Mother) Patient understands diagnosis: Yes. Discussing patient identified problems/goals with staff: Yes. Medical problems stabilized or resolved: Yes. Denies suicidal/homicidal ideation: Yes. Issues/concerns per patient self-inventory: Yes. Other:   New problem(s) identified: No, Describe:  None Reported  New Short Term/Long Term Goal(s): stabilization, elimination of SI thoughts, development of comprehensive mental wellness plan.  medication     Patient  Goals:  I want to go home but I would like to be connected with a therapist  Discharge Plan or Barriers: Patient recently admitted. CSW will continue to follow and assess for appropriate referrals and possible discharge planning.      Reason for Continuation of Hospitalization: Anxiety Depression Medication stabilization Suicidal ideation  Estimated Length of Stay: 3-7 Days  Last 3 [place] Suicide Severity Risk Score: [place] Admission (Current) from 09/02/2022 in [place] [facility] ED from 09/01/2022 in [place] [facility] at [facility] ED from 07/10/2022 in [facility] Emergency Department at [place] No Risk High Risk No Risk       Last PHQ 2/9 Scores:     No data to display           medication stabilization, elimination of SI thoughts, development of comprehensive mental wellness plan.   Scribe for Treatment Team: Windle Guard, LCSW 09/03/2022 1:46 PM

## 2022-09-03 NOTE — Group Note (Signed)
Recreation Therapy Group Note   Group Topic:Health and Wellness  Group Date: 09/03/2022 Start Time: 1610 End Time: 0958 Facilitators: Mikhi Athey-McCall, LRT,CTRS Location: [address] Dayroom    Goal Area(s) Addresses:  Patient will define components of whole wellness. Patient will verbalize benefit of whole wellness.  Group Description: Mental Gymnastics. LRT and patients discussed the components of wellness (mental, physical and spiritual). LRT and patients also discussed the importance of wellness and how it affects [place] on a daily basis. LRT then gave patients two worksheets of brain teasers. LRT explained to patients instead of doing physical exercise, they were going to exercise their brains and solve the brain teasers presented on the worksheets. Patients were given 20 minutes to decode as many of brain teasers they could before they went over them as a group    Affect/Mood: Appropriate   Participation Level: Active   Participation Quality: Independent   Behavior: Appropriate   Speech/Thought Process: Focused   Insight: Good   Judgement: Good   Modes of Intervention: Worksheet   Patient Response to Interventions:  Attentive   Education Outcome:  Acknowledges education and In group clarification offered    Clinical Observations/Individualized Feedback: Pt worked well with peers.  Pt had moments where he couldn't concentrate and was distracted.  Overall, pt was appropriate and engaged with peers.    Plan: Continue to engage patient in RT group sessions 2-3x/week.   Monnica Saltsman-McCall, LRT,CTRS 09/03/2022 11:20 AM

## 2022-09-03 NOTE — Progress Notes (Signed)
   09/03/22 0855  Psych Admission Type (Psych Patients Only)  Admission Status Voluntary  Psychosocial Assessment  Patient Complaints Worrying  Eye Contact Fair  Facial Expression Anxious  Affect Appropriate to circumstance  Speech Logical/coherent  Interaction Assertive  Motor Activity Other (Comment) (WNL)  Appearance/Hygiene Improved  Behavior Characteristics Cooperative;Appropriate to situation  Mood Pleasant  Aggressive Behavior  Effect No apparent injury  Thought Process  Coherency WDL  Content WDL  Delusions None reported or observed  Perception WDL  Hallucination None reported or observed  Judgment WDL  Confusion None  Danger to Self  Current suicidal ideation? Denies  Danger to Others  Danger to Others None reported or observed

## 2022-09-04 NOTE — Progress Notes (Signed)
   09/04/22 0545  15 Minute Checks  Location Bedroom  Visual Appearance Calm  Behavior Sleeping  Sleep (Behavioral Health Patients Only)  Calculate sleep? (Click Yes once per 24 hr at 0600 safety check) Yes  Documented sleep last 24 hours 7.75

## 2022-09-04 NOTE — Hospital Course (Addendum)
Reason for Admission:   James Mclaughlin is a 22 y.o. male with a history of ADHD and asthma, no inpatient psych admission, no suicide attempt, no NSSIB, who was initially admitted for inpatient psychiatric hospitalization on 09/02/2022 for management of SI in the setting of trauma that occurred 2 years ago for which he did not receive psychiatric evaluation. This is patient's first inpatient psych admission Total duration of encounter: 4 days   Past Psychiatric History:  Previous Psych Diagnoses: personal h/o ADHD Prior psychiatric treatment: ADHD rx, doesn't know which one, Dc'd in 8th grade Psychiatric medication compliance history: N/A Current psychiatric treatment: None Current psychiatrist: None Current therapist: None   Previous hospitalizations: none History of suicide attempts: None History of self harm: None   Substance Use History: Alcohol: One bootleg every other month  Hx withdrawal tremors/shakes: None Hx alcohol related blackouts None Hx alcohol induced hallucinations: None Hx alcoholic seizures: None DUI: None   Past Medical/Surgical History:  Medical Diagnoses: ADHD, asthma Home Rx: Inhaler PRN  Prior Hosp: Hospitalized around 22 y.o. for "heart murmur"  Prior Surgeries / non-head trauma: None   Head trauma: Concussion from playing football  Migraines:None Seizures: None   Allergies: Pollen, bee/wasp stings    Family History:  Medical: Heart disease in maternal and paternal side Psych: Mother has depression and anxiety  Psych Rx: Unsure Suicide: None Substance use family hx: maternal grandmother - EtOH abuse    Social History:  Place of birth and grew up where: [place] Abuse: None Marital Status: Single Children: Step son 38.70 years old Employment: Personal assistant: Graduated high school Housing: Lives with mother, two sisters, and brother Finances: N/A Legal: None Military: N/A Weapons: No access to firearm  Pills stockpile: None   Tobacco:  None Marijuana: Smokes 2x/week, Vapes nicotine daily (using up a cartridge every 2 weeks) Cocaine: None Methamphetamines: None Ecstasy: None Opiates: None Benzodiazepines: None Prescribed Meds abuse: None   History of Detox / Rehab: None  Collateral from 09-05-2022: Augustin Schooling [phone]): From her perspective, patient has been "doing ok," and has not endorsed further SI. She has no safety concerns about patient returning home tomorrow. She would like to pick him up tomorrow around noon.  She is appreciative of the call.     Collateral from 09-04-22: Girlfriend, Denton Ar [phone]: The only thing reported to her was that he wanted therapy. She says he never told her he has had thoughts of harm to himself. He did not have an ED visit last week in which he reported SI. No recent changes to mood or behaviors. To her knowledge, he uses vape but no other tobacco, alcohol, or substance use. She believes that he is safe. No access to guns or weapons at home to her knowledge. She says that they have open and honest communication about mental health. She spoke to the patient just prior to this writer calling her.    Collateral from 09-02-22: Nelly Rout (Mother) [phone] [facility])  Mom stated that patient did not do CPR on grandmother. Mom reported that the patient did not go to the ED last week for himself, rather he was accompanying a male friend to the ED because she had a stomach bug. On wednesday, the day he presented [facility] ED, mom stated that patient was in a good mood, being with friends and sister. Appeared to be at baseline. Then later that evening patient texted mom, saying that he was suicidal, and no further information. Mom felt that patient is  with-holding how patient really feels and his sxs.  Patient would ball up at night and cry at times because patient felt that he was a burden to mom.  Mom reported that she does not have any safety concerns, believes the  patient would not have killed himself despite SI.  Mom is unsure of what prompted the SI.  Mom reported that she believes that he has depression, however does not think it is so severe that he would harm himself.  Rather because he endorsed SI, mom wanted to take that thought seriously. _____________________________  Psychiatric diagnoses provided upon initial assessment:  Principal Problem:   PTSD (post-traumatic stress disorder) Active Problems:   Tobacco use disorder  Patient's psychiatric medications were adjusted on admission:  Start SSRI for PTSD symptoms with sertraline 25 mg on 2/1, plan to increase to 50 mg on 2/2   During the hospitalization, other adjustments were made to the patient's psychiatric medication regimen:  Up titrated zoloft 25 mg to 50 mg daily  During the hospitalization, patient's work-up:  Body mass index is 29.76 kg/m. UDS negative Ethanol, salicylate, and acetaminophen negative  CMP: AST 32, ALT 50, total Bili 1.3, , otherwise wnl CBC: Hg 17.3, , otherwise wnl Lipid panel showed LDL 106, otherwise wnl WNL: TSH, A1c  Patient's care was discussed during the interdisciplinary team meeting every day during the hospitalization.  Patient's side effects to prescribed psychiatric medications: transient fatigue, self resolved. Some activating side effects  Assessment  Gradually, patient started adjusting to milieu. The patient was evaluated each day by a clinical provider to ascertain response to treatment. Improvement was noted by the patient's report of decreasing symptoms, improved sleep and appetite, affect, medication tolerance, behavior, and participation in unit programming.  Patient was asked each day to complete a self inventory noting mood, mental status, pain, new symptoms, anxiety and concerns.    Symptoms were reported as significantly decreased or resolved completely by discharge.   On day of discharge, the patient reports that their mood is  stable. The patient denied having suicidal thoughts for more than 48 hours prior to discharge.  Patient denies having homicidal thoughts. Patient denies having auditory hallucinations. Patient denies any visual hallucinations or other symptoms of psychosis. The patient was motivated to continue taking medication with a goal of continued improvement in mental health.   The patient reports their target psychiatric symptoms of suicidal ideation, mood lability, depression, anxiety responded well to the psychiatric medications, and the patient reports overall benefit other psychiatric hospitalization. Supportive psychotherapy was provided to the patient. The patient also participated in regular group therapy while hospitalized. Coping skills, problem solving as well as relaxation therapies were also part of the unit programming.  Labs were reviewed with the patient, and abnormal results were discussed with the patient.  The patient is able to verbalize their individual safety plan to this provider.  While future psychiatric events cannot be accurately predicted, the patient does not currently require acute inpatient psychiatric care and does not currently meet [facility] involuntary commitment criteria.  Behavioral Events: None. Pleasant, calm, cooperative  Restraints: None  Groups: attended appropriately, engaged  # It is recommended to the patient to continue psychiatric medications as prescribed, after discharge from the hospital.    # It is recommended to the patient to follow up with your outpatient psychiatric provider and PCP.  # It was discussed with the patient, the impact of alcohol, drugs, tobacco have been there overall psychiatric and medical wellbeing, and  total abstinence from substance use was recommended to the patient.  # Prescriptions provided or sent directly to preferred pharmacy at discharge. Patient agreeable to plan. Given opportunity to ask questions. Appears to feel  comfortable with discharge.    # In the event of worsening symptoms, the patient is instructed to call the crisis hotline, 911 and or go to the nearest ED for appropriate evaluation and treatment of symptoms. To follow-up with primary care provider for other medical issues, concerns and or health care needs  # Patient was discharged HOME with a plan to follow up as noted below.  ____________________________  # Follow-up with outpatient primary care doctor and other specialists -for management of chronic medical disease, including:  Elevated HR  # Testing: Follow-up with outpatient provider for abnormal lab results:  See above

## 2022-09-04 NOTE — [facility] Group Notes (Signed)
.  Psychoeducational Group Note    Date:  09/04/2022 Time:1300-1400    Purpose of Group: . The group focus' on teaching patients on how to identify their needs and their Life Skills:  A group where two lists are made. What people need and what are things that we do that are unhealthy. The lists are developed by the patients and it is explained that we often do the actions that are not healthy to get our list of needs met.  Goal:: to develop the coping skills needed to get their needs met  Participation Level:  Active  Participation Quality:  Appropriate  Affect:  Appropriate  Cognitive:  Oriented  Insight: Improving  Engagement in Group:  Engaged  Modes of Intervention:  Activity, Discussion, Education, and Support  Additional Comments:  Rates energy at a 10/10  Bryson Dames A

## 2022-09-04 NOTE — Progress Notes (Signed)
Pt medication compliant. Pt on unit attending groups. Pt complains of feeling "hyper." Pt denies suicidal/homicidal thoughts as well as a/v hallucinations. Q 15 minute checks ongoing.

## 2022-09-04 NOTE — Plan of Care (Signed)
  Problem: Education: Goal: Emotional status will improve Outcome: Progressing Goal: Mental status will improve Outcome: Progressing   Problem: Activity: Goal: Interest or engagement in activities will improve Outcome: Progressing   Problem: Coping: Goal: Ability to demonstrate self-control will improve Outcome: Progressing   

## 2022-09-04 NOTE — Progress Notes (Signed)
    09/03/22 2123  Psych Admission Type (Psych Patients Only)  Admission Status Voluntary  Psychosocial Assessment  Patient Complaints Anxiety;Worrying  Eye Contact Fair  Facial Expression Anxious  Affect Appropriate to circumstance  Speech Logical/coherent  Interaction Assertive  Motor Activity Other (Comment) (WDL)  Appearance/Hygiene Improved  Behavior Characteristics Cooperative;Appropriate to situation  Mood Pleasant  Thought Process  Coherency WDL  Content WDL  Delusions None reported or observed  Perception WDL  Hallucination None reported or observed  Judgment WDL  Confusion None  Danger to Self  Current suicidal ideation? Denies  Danger to Others  Danger to Others None reported or observed

## 2022-09-04 NOTE — Progress Notes (Signed)
[facility] MD Progress Note  09/04/2022 8:01 AM James Mclaughlin  MRN:  [id number]   Reason for Admission:  James Mclaughlin is a 22 y.o. male with a history of ADHD and asthma, who was initially admitted for inpatient psychiatric hospitalization on 09/02/2022 for management of SI in the setting of trauma that occurred 2 years ago for which he did not receive psychiatric evaluation.The patient is currently on Hospital Day 2.   Chart Review from last 24 hours:  The patient's chart was reviewed and nursing notes were reviewed. The patient's case was discussed in multidisciplinary team meeting. Per [facility], patient was taking medications appropriately. Per nursing, patient is calm and cooperative and attended 1 group. Patient received the following PRN medications:  Atarax 25 mg x2 Nicorette x3 Trazadone 50 mg x1  Information Obtained Today During Patient Interview: The patient was seen and evaluated on the unit. On assessment today the patient reports "feeling good" and inquires about the discharge plan, stating that he obtained his girlfriend's phone number so that she could be contacted.  He feels anxious about when he can leave.  He reiterates that he reported SI only to obtain help faster.  It is explained to him the gravity of making a threat toward life, and he voices understanding.  He says that he would like an individual therapist to help him sort through his relationship with his father and to figure out who he is.  He denies SI, HI, AVH. He reports that his energy level has improved with the medication, but does not endorse symptoms consistent with mania.  He is advised that we would like to continue to monitor those symptoms to ensure he does not have an adverse reaction.  He denies any side effects of medication as well as further somatic concerns or complaints.    Collateral from 09-04-22: Girlfriend, Colin Mulders [phone]: The only thing reported to her was that he wanted therapy. She says he never told her  he has had thoughts of harm to himself. He did not have an ED visit last week in which he reported SI. No recent changes to mood or behaviors. To her knowledge, he uses vape but no other tobacco, alcohol, or substance use. She believes that he is safe. No access to guns or weapons at home to her knowledge. She says that they have open and honest communication about mental health. She spoke to the patient just prior to this writer calling her.   Collateral from 09-02-22: Hessie Knows (Mother) [phone] [facility])  Mom stated that patient did not do CPR on grandmother. Mom reported that the patient did not go to the ED last week for himself, rather he was accompanying a male friend to the ED because she had a stomach bug. On wednesday, the day he presented [facility] ED, mom stated that patient was in a good mood, being with friends and sister. Appeared to be at baseline. Then later that evening patient texted mom, saying that he was suicidal, and no further information. Mom felt that patient is with-holding how patient really feels and his sxs.  Patient would ball up at night and cry at times because patient felt that he was a burden to mom.  Mom reported that she does not have any safety concerns, believes the patient would not have killed himself despite SI.  Mom is unsure of what prompted the SI.  Mom reported that she believes that he has depression, however does not think it is so severe  that he would harm himself.  Rather because he endorsed SI, mom wanted to take that thought seriously.   Principal Problem: PTSD (post-traumatic stress disorder) Diagnosis: Principal Problem:   PTSD (post-traumatic stress disorder) Active Problems:   Tobacco use disorder  Past Psychiatric History: None  Past Medical History:  Past Medical History:  Diagnosis Date   Asthma    History reviewed. No pertinent surgical history. Family History:  Family History  Problem Relation Age of Onset    Healthy Mother    Healthy Father    Family Psychiatric  History:  Maternal grandmother - history of EtOH abuse   Social History:  Place of birth and grew up where: [place] Abuse: None Marital Status: Single Children: Step son 17.86 years old Employment: Personal assistant: Graduated high school Housing: Lives with mother, two sisters, and brother Finances: N/A Legal: None Military: N/A Weapons: No access to firearm  Pills stockpile: None  Current Medications: Current Facility-Administered Medications  Medication Dose Route Frequency Provider Last Rate Last Admin   acetaminophen (TYLENOL) tablet 650 mg  650 mg Oral Q6H PRN [name], NP       albuterol (VENTOLIN HFA) 108 (90 Base) MCG/ACT inhaler 1-2 puff  1-2 puff Inhalation Q6H PRN [name], [name], MD       alum & mag hydroxide-simeth (MAALOX/MYLANTA) 200-200-20 MG/5ML suspension 30 mL  30 mL Oral Q4H PRN [name], NP       hydrOXYzine (ATARAX) tablet 25 mg  25 mg Oral TID PRN [name], MD   25 mg at 09/03/22 2123   ipratropium (ATROVENT) 0.06 % nasal spray 2 spray  2 spray Nasal QID PRN [name], NP       OLANZapine zydis (ZYPREXA) disintegrating tablet 5 mg  5 mg Oral Q8H PRN [name], DO       And   LORazepam (ATIVAN) tablet 1 mg  1 mg Oral PRN [name], DO       And   ziprasidone (GEODON) injection 20 mg  20 mg Intramuscular PRN [name], DO       magnesium hydroxide (MILK OF MAGNESIA) suspension 30 mL  30 mL Oral Daily PRN [name], NP       nicotine (NICODERM CQ - dosed in mg/24 hours) patch 21 mg  21 mg Transdermal Daily [name], DO       nicotine polacrilex (NICORETTE) gum 4 mg  4 mg Oral PRN [name], DO   4 mg at 09/04/22 0753   ondansetron (ZOFRAN-ODT) disintegrating tablet 4 mg  4 mg Oral Q8H PRN [name], NP       sertraline (ZOLOFT) tablet 50 mg  50 mg Oral Daily [name], Nathan, MD   50 mg at 09/03/22 0750   traZODone (DESYREL) tablet 50 mg  50 mg  Oral QHS PRN [name], MD   50 mg at 09/03/22 2123    Lab Results:  Results for orders placed or performed during the hospital encounter of 09/02/22 (from the past 48 hour(s))  TSH     Status: None   Collection Time: 09/03/22  6:34 PM  Result Value Ref Range   TSH 2.053 0.350 - 4.500 uIU/mL    Comment: Performed by a 3rd Generation assay with a functional sensitivity of <=0.01 uIU/mL. Performed at [facility], [place] [address], [place], [place] [address]   Hemoglobin A1c     Status: None   Collection Time: 09/03/22  6:34 PM  Result Value Ref Range  Hgb A1c MFr Bld 5.0 4.8 - 5.6 %    Comment: (NOTE) Pre diabetes:          5.7%-6.4%  Diabetes:              >6.4%  Glycemic control for   <7.0% adults with diabetes    Mean Plasma Glucose 96.8 mg/dL    Comment: Performed at [facility], [place] [address] [place], [place] [address]  Lipid panel     Status: Abnormal   Collection Time: 09/03/22  6:34 PM  Result Value Ref Range   Cholesterol 183 0 - 200 mg/dL   Triglycerides 49 <150 mg/dL   HDL 67 >40 mg/dL   Total CHOL/HDL Ratio 2.7 RATIO   VLDL 10 0 - 40 mg/dL   LDL Cholesterol 106 (H) 0 - 99 mg/dL    Comment:        Total Cholesterol/HDL:CHD Risk Coronary Heart Disease Risk Table                     Men   Women  1/2 Average Risk   3.4   3.3  Average Risk       5.0   4.4  2 X Average Risk   9.6   7.1  3 X Average Risk  23.4   11.0        Use the calculated Patient Ratio above and the CHD Risk Table to determine the patient's CHD Risk.        ATP III CLASSIFICATION (LDL):  <100     mg/dL   Optimal  100-129  mg/dL   Near or Above                    Optimal  130-159  mg/dL   Borderline  160-189  mg/dL   High  >190     mg/dL   Very High Performed at [facility] [address] [place], [place] [address]     Blood Alcohol level:  Lab Results  Component Value Date   ETH <10 26/94/8546    Metabolic Disorder  Labs: Lab Results  Component Value Date   HGBA1C 5.0 09/03/2022   MPG 96.8 09/03/2022   No results found for: "PROLACTIN" Lab Results  Component Value Date   CHOL 183 09/03/2022   TRIG 49 09/03/2022   HDL 67 09/03/2022   CHOLHDL 2.7 09/03/2022   VLDL 10 09/03/2022   LDLCALC 106 (H) 09/03/2022    Physical Findings: AIMS:  , ,  ,  ,     Musculoskeletal: Strength & Muscle Tone: within normal limits Gait & Station: normal Patient leans: N/A  Psychiatric Specialty Exam:  General Appearance: appears at stated age, casually dressed and groomed   Behavior: pleasant and cooperative however mildly anxious   Psychomotor Activity: no psychomotor agitation or retardation noted   Eye Contact: fair Speech: normal amount, tone, volume and fluency   Mood: euthymic Affect: congruent, pleasant and interactive  Thought Process: linear, goal directed, no circumstantial or tangential thought process noted, no racing thoughts or flight of ideas Descriptions of Associations: intact Thought Content: no bizarre content, logical and future-oriented Hallucinations: denies AH, VH , does not appear responding to stimuli Delusions: no paranoia, delusions of control, grandeur, ideas of reference, thought broadcasting, and magical thinking Suicidal Thoughts: denies SI, intention, plan  Homicidal Thoughts: denies HI, intention, plan   Alertness/Orientation: alert and fully oriented  Insight: limited Judgment: limited  Memory: intact  Executive Functions  Concentration: intact  Attention Span: fair Recall: intact Fund of Knowledge: fair  Animal nutritionist; Desire for Improvement; Financial Resources/Insurance; Housing; Social Support; Vocational/Educational    Physical Exam: Constitutional:      Appearance: Normal appearance.  Cardiovascular:     Rate and Rhythm: Normal rate.  Pulmonary:     Effort: Pulmonary effort is normal.  Neurological:     General: No focal  deficit present.     Mental Status: Alert and oriented to person, place, and time.    Review of Systems  Constitutional: Negative.  Negative for chills, fever and weight loss.  HENT: Negative.    Eyes: Negative.   Respiratory: Negative.    Cardiovascular: Negative.   Gastrointestinal:  Negative for constipation, diarrhea, nausea and vomiting.  Genitourinary: Negative.   Musculoskeletal: Negative.   Skin: Negative.   Neurological: Negative.  Negative for tingling.   ASSESSMENT:  Diagnoses / Active Problems: Principal Problem: PTSD (post-traumatic stress disorder) Diagnosis: Principal Problem:   PTSD (post-traumatic stress disorder) Active Problems:   Tobacco use disorder   PLAN: Safety and Monitoring:  -- Voluntary admission to inpatient psychiatric unit for safety, stabilization and treatment  -- Daily contact with patient to assess and evaluate symptoms and progress in treatment  -- Patient's case to be discussed in multi-disciplinary team meeting  -- Observation Level : q15 minute checks  -- Vital signs:  q12 hours  -- Precautions: suicide, elopement, and assault  -- STARTED Agitation PRNs  2. Psychiatric Diagnosis and treatment PTSD vs MDD  -- Continue Zoloft 50 mg daily -- The risks/benefits/side-effects/alternatives to this medication were discussed in detail with the patient and time was given for questions. The patient consents to medication trial.   -- Order Metabolic profile including lipid panel, A1c, TSH, and EKG monitoring  Scheduled medications nicotine, 21 mg, Daily sertraline, 50 mg, Daily    PRN Medications acetaminophen, 650 mg, Q6H PRN albuterol, 1-2 puff, Q6H PRN alum & mag hydroxide-simeth, 30 mL, Q4H PRN hydrOXYzine, 25 mg, TID PRN ipratropium, 2 spray, QID PRN OLANZapine zydis, 5 mg, Q8H PRN  And LORazepam, 1 mg, PRN  And ziprasidone, 20 mg, PRN magnesium hydroxide, 30 mL, Daily PRN nicotine polacrilex, 4 mg, PRN ondansetron, 4 mg, Q8H  PRN traZODone, 50 mg, QHS PRN   3. Pertinent labs:  UDS negative Ethanol, salicylate, and acetaminophen negative  CMP: AST 32, ALT 50, total Bili 1.3 CBC: Hg 17.3     Lab ordered: Lipid panel, A1c, TSH, EKG   4. Tobacco Use Disorder  -- Continued Nicotine patch 21mg /24 hours  -Continue nicotine gum 4 mg PRN due to intense craving  -- Smoking cessation encouraged  5. Group and Therapy: -- Encouraged patient to participate in unit milieu and in scheduled group therapies   -- Short Term Goals: Ability to identify changes in lifestyle to reduce recurrence of condition will improve, Ability to verbalize feelings will improve, Ability to disclose and discuss suicidal ideas, Ability to identify and develop effective coping behaviors will improve, and Ability to maintain clinical measurements within normal limits will improve  -- Long Term Goals: Improvement in symptoms so as ready for discharge  6. Discharge Planning:   -- Social work and case management to assist with discharge planning and identification of hospital follow-up needs prior to discharge  -- Estimated LOS: 5-7 days  -- Discharge Concerns: Need to establish a safety plan; Medication compliance and effectiveness  -- Discharge Goals: Return home  with outpatient referrals for mental health follow-up including medication management/psychotherapy   Lamar Sprinkles, MD PGY-2 [facility] Department of Psychiatry  09/04/2022, 8:01 AM

## 2022-09-04 NOTE — [facility] Group Notes (Signed)
Goals Group 09/04/22   Group Focus: affirmation, clarity of thought, and goals/reality orientation Treatment Modality:  Psychoeducation Interventions utilized were assignment, group exercise, and support Purpose: To be able to understand and verbalize the reason for their admission to the hospital. To understand that the medication helps with their chemical imbalance but they also need to work on their choices in life. To be challenged to develop a list of 30 positives about themselves. Also introduce the concept that "feelings" are not reality.  Participation Level:  Active  Participation Quality:  Appropriate  Affect:  Appropriate  Cognitive:  Appropriate  Insight:  Improving  Engagement in Group:  Engaged  Additional Comments: Rates his energy at a 10/10.  Paulino Rily

## 2022-09-05 MED ORDER — SERTRALINE HCL 50 MG PO TABS
50.0000 mg | ORAL_TABLET | Freq: Every day | ORAL | Status: DC
  Administered 2022-09-06: 50 mg via ORAL
  Filled 2022-09-05 (×3): qty 1

## 2022-09-05 MED ORDER — TRAZODONE HCL 50 MG PO TABS
50.0000 mg | ORAL_TABLET | Freq: Every evening | ORAL | Status: DC | PRN
  Administered 2022-09-05: 50 mg via ORAL
  Filled 2022-09-05 (×7): qty 1

## 2022-09-05 MED ORDER — SERTRALINE HCL 50 MG PO TABS: 50.0000 mg | ORAL_TABLET | Freq: Every evening | ORAL | Status: DC | PRN

## 2022-09-05 MED ORDER — NICOTINE POLACRILEX 2 MG MT GUM
4.0000 mg | CHEWING_GUM | OROMUCOSAL | Status: DC | PRN
  Administered 2022-09-05: 4 mg via ORAL

## 2022-09-05 NOTE — [facility] Group Notes (Signed)
Adult Psychoeducational Group Note Date:  09/05/2022 Time:  0900-1000 Group Topic/Focus: PROGRESSIVE RELAXATION. A group where deep breathing is taught and tensing and relaxation muscle groups is used. Imagery is used as well.  Pts are asked to imagine 3 pillars that hold them up when they are not able to hold themselves up and to share that with the group.   Participation Level:  Active  Participation Quality:  Appropriate  Affect:  Appropriate  Cognitive:  Approprate  Insight: Improving  Engagement in Group:  Engaged  Modes of Intervention:  deep breathing, Imagery. Discussion  Additional Comments:  rates energy at a 10/10. States mom, God and his siblings hold him up   : James Mclaughlin

## 2022-09-05 NOTE — [facility] Group Notes (Signed)
Adult Psychoeducational Group  Date:  09/05/2022 Time: 1300-1400  Group Topic/Focus: Continuation of the group from Saturday. Looking at the lists that were created and talking about what needs to be done with the homework of 30 positives about themselves.                                     Talking about taking their power back and helping themselves to develop a positive self esteem.      Participation Quality:  Appropriate  Affect:  Appropriate  Cognitive:  Oriented  Insight: Improving  Engagement in Group:  Engaged  Modes of Intervention:  Activity, Discussion, Education, and Support  Additional Comments:  Pt rates his energy at a 6/10. Participated in the group  Bryson Dames A

## 2022-09-05 NOTE — Progress Notes (Signed)
[facility] MD Progress Note  09/05/2022 8:09 AM James Mclaughlin  MRN:  [id number]   Reason for Admission:  James Mclaughlin is a 22 y.o. male with a history of ADHD and asthma, who was initially admitted for inpatient psychiatric hospitalization on 09/02/2022 for management of SI in the setting of trauma that occurred 2 years ago for which he did not receive psychiatric evaluation.The patient is currently on Hospital Day 3.   Chart Review from last 24 hours:  The patient's chart was reviewed and nursing notes were reviewed. The patient's case was discussed in multidisciplinary team meeting. Per [facility], patient was taking medications appropriately. Per nursing, patient is calm and cooperative and attended 1 group. Patient received the following PRN medications:  Atarax 25 mg x2 Nicorette x3 Trazadone 50 mg x1  Information Obtained Today During Patient Interview: On assessment today the patient reports "feeling good" and inquires about the discharge plan and whether his girlfriend was reached.  He voices understanding when advised that although his girlfriend corroborated his reporting, mom would need to be agreeable to his return home as he resides with her. He denies SI, HI, AVH. He continues to report that his energy level, as well as his irritability have improved with the medication, but does not endorse symptoms consistent with mania.  He did toss and turn last night, but denies nightmares and is unable to pinpoint the reason for his interrupted sleep. He denies any side effects of medication as well as further somatic concerns or complaints.    Collateral from 09-05-2022: Augustin Schooling [phone]): From her perspective, patient has been "doing ok," and has not endorsed further SI. She has no safety concerns about patient returning home tomorrow. She would like to pick him up tomorrow around noon.  She is appreciative of the call.   Collateral from 09-04-22: Girlfriend, [place] Ar [phone]: The only thing  reported to her was that he wanted therapy. She says he never told her he has had thoughts of harm to himself. He did not have an ED visit last week in which he reported SI. No recent changes to mood or behaviors. To her knowledge, he uses vape but no other tobacco, alcohol, or substance use. She believes that he is safe. No access to guns or weapons at home to her knowledge. She says that they have open and honest communication about mental health. She spoke to the patient just prior to this writer calling her.   Collateral from 09-02-22: Nelly Rout (Mother) [phone] [facility])  Mom stated that patient did not do CPR on grandmother. Mom reported that the patient did not go to the ED last week for himself, rather he was accompanying a male friend to the ED because she had a stomach bug. On wednesday, the day he presented [facility] ED, mom stated that patient was in a good mood, being with friends and sister. Appeared to be at baseline. Then later that evening patient texted mom, saying that he was suicidal, and no further information. Mom felt that patient is with-holding how patient really feels and his sxs.  Patient would ball up at night and cry at times because patient felt that he was a burden to mom.  Mom reported that she does not have any safety concerns, believes the patient would not have killed himself despite SI.  Mom is unsure of what prompted the SI.  Mom reported that she believes that he has depression, however does not think it is so severe that he  would harm himself.  Rather because he endorsed SI, mom wanted to take that thought seriously.   Principal Problem: PTSD (post-traumatic stress disorder) Diagnosis: Principal Problem:   PTSD (post-traumatic stress disorder) Active Problems:   Tobacco use disorder  Past Psychiatric History: None  Past Medical History:  Past Medical History:  Diagnosis Date   Asthma    History reviewed. No pertinent surgical  history. Family History:  Family History  Problem Relation Age of Onset   Healthy Mother    Healthy Father    Family Psychiatric  History:  Maternal grandmother - history of EtOH abuse   Social History:  Place of birth and grew up where: [employer] Abuse: None Marital Status: Single Children: Step son 46.26 years old Employment: Press photographer: Graduated high school Housing: Lives with mother, two sisters, and brother Finances: N/A Legal: None Military: N/A Weapons: No access to firearm  Pills stockpile: None  Current Medications: Current Facility-Administered Medications  Medication Dose Route Frequency Provider Last Rate Last Admin   acetaminophen (TYLENOL) tablet 650 mg  650 mg Oral Q6H PRN Sindy Guadeloupe, NP       albuterol (VENTOLIN HFA) 108 (90 Base) MCG/ACT inhaler 1-2 puff  1-2 puff Inhalation Q6H PRN [name], Harrold Donath, MD       alum & mag hydroxide-simeth (MAALOX/MYLANTA) 200-200-20 MG/5ML suspension 30 mL  30 mL Oral Q4H PRN Sindy Guadeloupe, NP       hydrOXYzine (ATARAX) tablet 25 mg  25 mg Oral TID PRN Phineas Inches, MD   25 mg at 09/04/22 2105   ipratropium (ATROVENT) 0.06 % nasal spray 2 spray  2 spray Nasal QID PRN Sindy Guadeloupe, NP       OLANZapine zydis (ZYPREXA) disintegrating tablet 5 mg  5 mg Oral Q8H PRN Princess Bruins, DO       And   LORazepam (ATIVAN) tablet 1 mg  1 mg Oral PRN Princess Bruins, DO       And   ziprasidone (GEODON) injection 20 mg  20 mg Intramuscular PRN Princess Bruins, DO       magnesium hydroxide (MILK OF MAGNESIA) suspension 30 mL  30 mL Oral Daily PRN Sindy Guadeloupe, NP       nicotine (NICODERM CQ - dosed in mg/24 hours) patch 21 mg  21 mg Transdermal Daily Princess Bruins, DO       nicotine polacrilex (NICORETTE) gum 4 mg  4 mg Oral PRN Princess Bruins, DO   4 mg at 09/04/22 2100   ondansetron (ZOFRAN-ODT) disintegrating tablet 4 mg  4 mg Oral Q8H PRN Sindy Guadeloupe, NP       sertraline (ZOLOFT) tablet 50 mg  50 mg Oral Daily [name],  Harrold Donath, MD   50 mg at 09/05/22 1610   traZODone (DESYREL) tablet 50 mg  50 mg Oral QHS PRN Phineas Inches, MD   50 mg at 09/04/22 2104    Lab Results:  Results for orders placed or performed during the hospital encounter of 09/02/22 (from the past 48 hour(s))  TSH     Status: None   Collection Time: 09/03/22  6:34 PM  Result Value Ref Range   TSH 2.053 0.350 - 4.500 uIU/mL    Comment: Performed by a 3rd Generation assay with a functional sensitivity of <=0.01 uIU/mL. Performed at [facility], [address] [place], [place] [address]   Hemoglobin A1c     Status: None   Collection Time: 09/03/22  6:34 PM  Result Value Ref Range  Hgb A1c MFr Bld 5.0 4.8 - 5.6 %    Comment: (NOTE) Pre diabetes:          5.7%-6.4%  Diabetes:              >6.4%  Glycemic control for   <7.0% adults with diabetes    Mean Plasma Glucose 96.8 mg/dL    Comment: Performed at [facility], [address] [place], [place] [address]  Lipid panel     Status: Abnormal   Collection Time: 09/03/22  6:34 PM  Result Value Ref Range   Cholesterol 183 0 - 200 mg/dL   Triglycerides 49 <010 mg/dL   HDL 67 >93 mg/dL   Total CHOL/HDL Ratio 2.7 RATIO   VLDL 10 0 - 40 mg/dL   LDL Cholesterol 235 (H) 0 - 99 mg/dL    Comment:        Total Cholesterol/HDL:CHD Risk Coronary Heart Disease Risk Table                     Men   Women  1/2 Average Risk   3.4   3.3  Average Risk       5.0   4.4  2 X Average Risk   9.6   7.1  3 X Average Risk  23.4   11.0        Use the calculated Patient Ratio above and the CHD Risk Table to determine the patient's CHD Risk.        ATP III CLASSIFICATION (LDL):  <100     mg/dL   Optimal  573-220  mg/dL   Near or Above                    Optimal  130-159  mg/dL   Borderline  254-270  mg/dL   High  >623     mg/dL   Very High Performed at [facility], [address] [place], [place] [address]     Blood Alcohol level:  Lab  Results  Component Value Date   ETH <10 09/01/2022    Metabolic Disorder Labs: Lab Results  Component Value Date   HGBA1C 5.0 09/03/2022   MPG 96.8 09/03/2022   No results found for: "PROLACTIN" Lab Results  Component Value Date   CHOL 183 09/03/2022   TRIG 49 09/03/2022   HDL 67 09/03/2022   CHOLHDL 2.7 09/03/2022   VLDL 10 09/03/2022   LDLCALC 106 (H) 09/03/2022    Physical Findings: AIMS:  , ,  ,  ,     Musculoskeletal: Strength & Muscle Tone: within normal limits Gait & Station: normal Patient leans: N/A  Psychiatric Specialty Exam:  General Appearance: appears at stated age, casually dressed and groomed   Behavior: pleasant and cooperative however mildly anxious   Psychomotor Activity: no psychomotor agitation or retardation noted   Eye Contact: fair Speech: normal amount, tone, volume and fluency   Mood: euthymic Affect: congruent, pleasant and interactive  Thought Process: linear, goal directed, no circumstantial or tangential thought process noted, no racing thoughts or flight of ideas Descriptions of Associations: intact Thought Content: no bizarre content, logical and future-oriented Hallucinations: denies AH, VH , does not appear responding to stimuli Delusions: no paranoia, delusions of control, grandeur, ideas of reference, thought broadcasting, and magical thinking Suicidal Thoughts: denies SI, intention, plan  Homicidal Thoughts: denies HI, intention, plan   Alertness/Orientation: alert and fully oriented  Insight: limited Judgment: limited  Memory: intact  Executive Functions  Concentration: intact  Attention Span: fair Recall: intact Fund of Knowledge: fair  Animal nutritionist; Desire for Improvement; Financial Resources/Insurance; Housing; Social Support; Vocational/Educational    Physical Exam: Constitutional:      Appearance: Normal appearance.  Cardiovascular:     Rate and Rhythm: Normal rate.  Pulmonary:      Effort: Pulmonary effort is normal.  Neurological:     General: No focal deficit present.     Mental Status: Alert and oriented to person, place, and time.    Review of Systems  Constitutional: Negative.  Negative for chills, fever and weight loss.  HENT: Negative.    Eyes: Negative.   Respiratory: Negative.    Cardiovascular: Negative.   Gastrointestinal:  Negative for constipation, diarrhea, nausea and vomiting.  Genitourinary: Negative.   Musculoskeletal: Negative.   Skin: Negative.   Neurological: Negative.  Negative for tingling.   ASSESSMENT:  Diagnoses / Active Problems: Principal Problem: PTSD (post-traumatic stress disorder) Diagnosis: Principal Problem:   PTSD (post-traumatic stress disorder) Active Problems:   Tobacco use disorder   PLAN: Safety and Monitoring:  -- Voluntary admission to inpatient psychiatric unit for safety, stabilization and treatment  -- Daily contact with patient to assess and evaluate symptoms and progress in treatment  -- Patient's case to be discussed in multi-disciplinary team meeting  -- Observation Level : q15 minute checks  -- Vital signs:  q12 hours  -- Precautions: suicide, elopement, and assault  -- STARTED Agitation PRNs  2. Psychiatric Diagnosis and treatment PTSD vs MDD  -- Continue Zoloft 50 mg daily  -- Increase to trazodone 50 mg nightly scheduled and x 1 as needed for sleep -- The risks/benefits/side-effects/alternatives to this medication were discussed in detail with the patient and time was given for questions. The patient consents to medication trial.   -- Ordered Metabolic profile including lipid panel, A1c, TSH, and EKG monitoring: LDL 106, otherwise WNL  Scheduled medications nicotine, 21 mg, Daily sertraline, 50 mg, Daily    PRN Medications acetaminophen, 650 mg, Q6H PRN albuterol, 1-2 puff, Q6H PRN alum & mag hydroxide-simeth, 30 mL, Q4H PRN hydrOXYzine, 25 mg, TID PRN ipratropium, 2 spray, QID  PRN OLANZapine zydis, 5 mg, Q8H PRN  And LORazepam, 1 mg, PRN  And ziprasidone, 20 mg, PRN magnesium hydroxide, 30 mL, Daily PRN nicotine polacrilex, 4 mg, PRN ondansetron, 4 mg, Q8H PRN traZODone, 50 mg, QHS PRN   3. Pertinent labs:  UDS negative Ethanol, salicylate, and acetaminophen negative  CMP: AST 32, ALT 50, total Bili 1.3 CBC: Hg 17.3     Lab ordered: Lipid panel, A1c, TSH, EKG   4. Tobacco Use Disorder  -- Discontinue Nicotine patch 21mg /24 hours  -Continue nicotine gum 4 mg PRN due to intense craving; do not give beyond 6:30 PM to mitigate difficulty sleeping  -- Smoking cessation encouraged  5. Group and Therapy: -- Encouraged patient to participate in unit milieu and in scheduled group therapies   -- Short Term Goals: Ability to identify changes in lifestyle to reduce recurrence of condition will improve, Ability to verbalize feelings will improve, Ability to disclose and discuss suicidal ideas, Ability to identify and develop effective coping behaviors will improve, and Ability to maintain clinical measurements within normal limits will improve  -- Long Term Goals: Improvement in symptoms so as ready for discharge  6. Discharge Planning:   -- Social work and case management to assist with discharge planning and identification of hospital follow-up  needs prior to discharge  -- Estimated date of discharge: 2/5  -- Discharge Concerns: Need to establish a safety plan; Medication compliance and effectiveness  -- Discharge Goals: Return home with outpatient referrals for mental health follow-up including medication management/psychotherapy   Rosezetta Schlatter, MD PGY-2 [facility] Department of Psychiatry  09/05/2022, 8:09 AM

## 2022-09-05 NOTE — Progress Notes (Signed)
Pt on unit. Pt medication compliant and attending group. Pt reports sleep disturbance last night noting " I tossed and turned". Pt denies suicidal thoughts. Q 15 minute checks ongoing.

## 2022-09-05 NOTE — [facility] Group Notes (Signed)
Wrap up group: Scale 1-10 10 

## 2022-09-05 NOTE — Progress Notes (Signed)
Patient denies SI, HI, and AVH this shift. Patient stated, "I am ready to go home". Patient was noted to be cheerful and playful on the unit. Patient stated "my medications as making me hyper". Patient requested PRN sleep medications and was able to sleep uninterrupted this evening. Patient compliant with medications, attended groups and has had no behavioral dyscontrol. Patient able to contract for safety. Continue to monitor as planned.

## 2022-09-05 NOTE — [facility] Group Notes (Signed)
Pt did not attend goals group. 

## 2022-09-06 DIAGNOSIS — F4310 Post-traumatic stress disorder, unspecified: Secondary | ICD-10-CM

## 2022-09-06 MED ORDER — DIPHENHYDRAMINE HCL 50 MG PO CAPS
50.0000 mg | ORAL_CAPSULE | Freq: Every day | ORAL | Status: AC
  Administered 2022-09-06: 50 mg via ORAL

## 2022-09-06 MED ORDER — NICOTINE POLACRILEX 4 MG MT GUM: 4.0000 mg | CHEWING_GUM | OROMUCOSAL | 0 refills | Status: AC | PRN

## 2022-09-06 MED ORDER — ALBUTEROL SULFATE HFA 108 (90 BASE) MCG/ACT IN AERS: 1.0000 | INHALATION_SPRAY | Freq: Four times a day (QID) | RESPIRATORY_TRACT | 0 refills | Status: AC | PRN

## 2022-09-06 MED ORDER — HYDROXYZINE HCL 25 MG PO TABS: 25.0000 mg | ORAL_TABLET | Freq: Three times a day (TID) | ORAL | 0 refills | Status: AC | PRN

## 2022-09-06 MED ORDER — SERTRALINE HCL 50 MG PO TABS: 50.0000 mg | ORAL_TABLET | Freq: Every day | ORAL | 0 refills | Status: AC

## 2022-09-06 MED ORDER — DIPHENHYDRAMINE HCL 25 MG PO CAPS
ORAL_CAPSULE | ORAL | Status: AC
  Filled 2022-09-06: qty 2

## 2022-09-06 NOTE — Discharge Instructions (Signed)
-  Follow-up with your outpatient psychiatric provider -instructions on appointment date, time, and address (location) are provided to you in discharge paperwork.  -Take your psychiatric medications as prescribed at discharge - instructions are provided to you in the discharge paperwork  -Follow-up with outpatient primary care doctor and other specialists -for management of preventative medicine and any chronic medical disease.  -Recommend abstinence from alcohol, tobacco, and other illicit drug use at discharge.   -If your psychiatric symptoms recur, worsen, or if you have side effects to your psychiatric medications, call your outpatient psychiatric provider, 911, [phone] or go to the nearest emergency department.  -If suicidal thoughts occur, call your outpatient psychiatric provider, 911, [phone] or go to the nearest emergency department.  Naloxone (Narcan) can help reverse an overdose when given to the victim quickly.  [place] offers free naloxone kits and instructions/training on its use.  Add naloxone to your first aid kit and you can help save a life.   Pick up your free kit at the following locations:   [place]:  [facility], [address] [place] [address] ([phone]) [facility] [address] [place] [id number] ([phone]) [facility] [address] [place] [address]  [place]: [facility] [address] [place] [address] ([phone]) [facility] [address] [place] [address] ([phone])  

## 2022-09-06 NOTE — [facility] Suicide Risk Assessment (Addendum)
[facility] Discharge Suicide Risk Assessment  Principal Problem: PTSD (post-traumatic stress disorder) Discharge Diagnoses: Principal Problem:   PTSD (post-traumatic stress disorder) Active Problems:   Tobacco use disorder   Reason for admission:  James Mclaughlin is a 22 y.o. male with a history of ADHD and asthma, no inpatient psych admission, no suicide attempt, no NSSIB, who was initially admitted for inpatient psychiatric hospitalization on 09/02/2022 for management of SI in the setting of trauma that occurred 2 years ago for which he did not receive psychiatric evaluation. This is patient's first inpatient psych admission Total duration of encounter: 4 days   Past Psychiatric History:  Previous Psych Diagnoses: personal h/o ADHD Prior psychiatric treatment: ADHD rx, doesn't know which one, Dc'd in 8th grade Psychiatric medication compliance history: N/A Current psychiatric treatment: None Current psychiatrist: None Current therapist: None   Previous hospitalizations: none History of suicide attempts: None History of self harm: None   Substance Use History: Alcohol: One bootleg every other month  Hx withdrawal tremors/shakes: None Hx alcohol related blackouts None Hx alcohol induced hallucinations: None Hx alcoholic seizures: None DUI: None   Past Medical/Surgical History:  Medical Diagnoses: ADHD, asthma Home Rx: Inhaler PRN  Prior Hosp: Hospitalized around 22 y.o. for "heart murmur"  Prior Surgeries / non-head trauma: None   Head trauma: Concussion from playing football  Migraines:None Seizures: None   Allergies: Pollen, bee/wasp stings    Family History:  Medical: Heart disease in maternal and paternal side Psych: Mother has depression and anxiety  Psych Rx: Unsure Suicide: None Substance use family hx: maternal grandmother - EtOH abuse    Social History:  Place of birth and grew up where: [place] Abuse: None Marital Status: Single Children: Step son 1.5 years  old Employment: Personal assistant: Graduated high school Housing: Lives with mother, two sisters, and brother Finances: N/A Legal: None Military: N/A Weapons: No access to firearm  Pills stockpile: None   Tobacco: None Marijuana: Smokes 2x/week, Vapes nicotine daily (using up a cartridge every 2 weeks) Cocaine: None Methamphetamines: None Ecstasy: None Opiates: None Benzodiazepines: None Prescribed Meds abuse: None   History of Detox / Rehab: None  Collateral from 09-05-2022: Augustin Schooling [phone]): From her perspective, patient has been "doing ok," and has not endorsed further SI. She has no safety concerns about patient returning home tomorrow. She would like to pick him up tomorrow around noon.  She is appreciative of the call.     Collateral from 09-04-22: Girlfriend, [place] Ar [phone]: The only thing reported to her was that he wanted therapy. She says he never told her he has had thoughts of harm to himself. He did not have an ED visit last week in which he reported SI. No recent changes to mood or behaviors. To her knowledge, he uses vape but no other tobacco, alcohol, or substance use. She believes that he is safe. No access to guns or weapons at home to her knowledge. She says that they have open and honest communication about mental health. She spoke to the patient just prior to this writer calling her.    Collateral from 09-02-22: Nelly Rout (Mother) [phone] [facility])  Mom stated that patient did not do CPR on grandmother. Mom reported that the patient did not go to the ED last week for himself, rather he was accompanying a male friend to the ED because she had a stomach bug. On wednesday, the day he presented [facility] ED, mom stated that patient was in a good mood, being  with friends and sister. Appeared to be at baseline. Then later that evening patient texted mom, saying that he was suicidal, and no further information. Mom felt that patient is  with-holding how patient really feels and his sxs.  Patient would ball up at night and cry at times because patient felt that he was a burden to mom.  Mom reported that she does not have any safety concerns, believes the patient would not have killed himself despite SI.  Mom is unsure of what prompted the SI.  Mom reported that she believes that he has depression, however does not think it is so severe that he would harm himself.  Rather because he endorsed SI, mom wanted to take that thought seriously.  PTA Medications:  No home rx  Hospital Course:   During the patient's hospitalization, patient had extensive initial psychiatric evaluation, and follow-up psychiatric evaluations every day.  Psychiatric diagnoses provided upon initial assessment:  Principal Problem:   PTSD (post-traumatic stress disorder) Active Problems:   Tobacco use disorder  Patient's psychiatric medications were adjusted on admission:  Start SSRI for PTSD symptoms with sertraline 25 mg on 2/1, plan to increase to 50 mg on 2/2   During the hospitalization, other adjustments were made to the patient's psychiatric medication regimen:  Up titrated zoloft 25 mg to 50 mg daily  During the hospitalization, patient's work-up:  Body mass index is 29.76 kg/m. UDS negative Ethanol, salicylate, and acetaminophen negative  CMP: AST 32, ALT 50, total Bili 1.3, , otherwise wnl CBC: Hg 17.3, , otherwise wnl Lipid panel showed LDL 106, otherwise wnl WNL: TSH, A1c  Patient's care was discussed during the interdisciplinary team meeting every day during the hospitalization.  Patient's side effects to prescribed psychiatric medications: transient fatigue, self resolved. Some activating side effects  Assessment  Gradually, patient started adjusting to milieu. The patient was evaluated each day by a clinical provider to ascertain response to treatment. Improvement was noted by the patient's report of decreasing symptoms, improved  sleep and appetite, affect, medication tolerance, behavior, and participation in unit programming.  Patient was asked each day to complete a self inventory noting mood, mental status, pain, new symptoms, anxiety and concerns.    Symptoms were reported as significantly decreased or resolved completely by discharge.   On day of discharge, the patient reports that their mood is stable. The patient denied having suicidal thoughts for more than 48 hours prior to discharge.  Patient denies having homicidal thoughts.  Patient denies having auditory hallucinations.  Patient denies any visual hallucinations or other symptoms of psychosis. The patient was motivated to continue taking medication with a goal of continued improvement in mental health.   The patient reports their target psychiatric symptoms of suicidal ideation, mood lability, depression, anxiety responded well to the psychiatric medications, and the patient reports overall benefit other psychiatric hospitalization. Supportive psychotherapy was provided to the patient. The patient also participated in regular group therapy while hospitalized. Coping skills, problem solving as well as relaxation therapies were also part of the unit programming.  Labs were reviewed with the patient, and abnormal results were discussed with the patient.  The patient is able to verbalize their individual safety plan to this provider.  Behavioral Events: None, pleasant, calm, cooperative  Restraints: None  Groups: attended appropriately, engaged  Medications Changes:  Started and titrated zoloft to 50 mg daily  D/C Medications:  sertraline, 50 mg, Daily traZODone, 50 mg, QHS,MR X 1    Musculoskeletal: Strength &  Muscle Tone: within normal limits Gait & Station: normal Patient leans: N/A   Presentation  General Appearance:Appropriate for Environment, Casual, Fairly Groomed Eye Contact:Good Speech:Clear and Coherent, Normal  Rate Volume:Normal Handedness:Right  Mood and Affect  Mood:Euthymic Affect:Appropriate, Congruent, Full Range  Thought Process  Thought Process:Coherent, Goal Directed, Linear Descriptions of Associations:Intact  Thought Content Suicidal Thoughts:Suicidal Thoughts: No Homicidal Thoughts:Homicidal Thoughts: No Hallucinations:Hallucinations: None Ideas of Reference:None Thought Content:Logical, WDL  Sensorium  Memory:Immediate Good Judgment:Fair Insight:Fair  Executive Functions  Orientation:Full (Time, Place and Person) Language:Good Concentration:Good Attention:Good [place] of Knowledge:Good  Psychomotor Activity  Psychomotor Activity:Psychomotor Activity: Normal  Assets  Assets:Communication Skills, Desire for Improvement, Resilience, Housing, Intimacy, Leisure Time, Physical Health, Social Support, Talents/Skills, Transportation, Vocational/Educational  Sleep  Quality:Good  Physical Exam Vitals and nursing note reviewed.  Constitutional:      General: He is not in acute distress.    Appearance: He is not ill-appearing, toxic-appearing or diaphoretic.  HENT:     Head: Normocephalic.  Pulmonary:     Effort: Pulmonary effort is normal. No respiratory distress.  Neurological:     Mental Status: He is alert.     Review of Systems  Respiratory:  Negative for shortness of breath.   Cardiovascular:  Negative for chest pain.  Gastrointestinal:  Negative for nausea and vomiting.  Neurological:  Negative for dizziness and headaches.     Blood pressure 113/63, pulse (!) 131, temperature 98.4 F (36.9 C), temperature source Oral, resp. rate 16, height 5\' 7"  (1.702 m), weight 86.2 kg, SpO2 97 %. Body mass index is 29.76 kg/m. Repeat manual HR 94  Mental Status Per Nursing Assessment::   On Admission:  NA  Demographic Factors:  Male and Adolescent or young adult  Loss Factors: Loss of significant relationship  Historical Factors: NA  Risk  Reduction Factors:   Sense of responsibility to family, Employed, Living with another person, especially a relative, Positive social support, Positive therapeutic relationship, and Positive coping skills or problem solving skills  Continued Clinical Symptoms:  Depression:   Impulsivity  Cognitive Features That Contribute To Risk:  None    Suicide Risk:  Mild:  Suicidal ideation of limited frequency, intensity, duration, and specificity.  There are no identifiable plans, no associated intent, mild dysphoria and related symptoms, good self-control (both objective and subjective assessment), few other risk factors, and identifiable protective factors, including available and accessible social support.   Follow-up Information     [place] on 09/10/2022.   Why: You have a hospital follow up appointment on 09/10/22 at 11:00 am.   This appointment will be held in person.  Following this appointment, you will be scheduled for a clinical assessment, to obtain therapy and medication management services. Contact information: [address]  [place] [address] [phone]                 Discharge recommendations:    Activity: as tolerated  Diet: heart healthy  # It is recommended to the patient to continue psychiatric medications as prescribed, after discharge from the hospital.     # It is recommended to the patient to follow up with your outpatient psychiatric provider -instructions on appointment date, time, and address (location) are provided to you in discharge paperwork  # Follow-up with outpatient primary care doctor and other specialists -for management of chronic medical disease, including:  N/A  # Testing: Follow-up with outpatient provider for abnormal lab results:  See above   # It  was discussed with the patient, the impact of alcohol, drugs, tobacco have been there overall psychiatric and medical wellbeing, and total abstinence from substance  use was recommended to the patient.   # Prescriptions provided or sent directly to preferred pharmacy at discharge. Patient agreeable to plan. Given opportunity to ask questions. Appears to feel comfortable with discharge.    # In the event of worsening symptoms, the patient is instructed to call the crisis hotline, 911 and or go to the nearest ED for appropriate evaluation and treatment of symptoms. To follow-up with primary care provider for other medical issues, concerns and or health care needs  Patient agrees with D/C instructions and plan.   Total Time Spent in Direct Patient Care: See attending attestation.  Signed: [name], DO Psychiatry Resident, PGY-2 [facility] 09/06/2022, 9:41 AM

## 2022-09-06 NOTE — Progress Notes (Signed)
Patient woke up with swelling to lips denies any difficult breathing denies Pain. Prn vistaril given PO and ice, swelling going down. Provider to assess Patient. Oncoming RN notified. Support and encouragement provided.

## 2022-09-06 NOTE — Group Note (Signed)
Recreation Therapy Group Note   Group Topic:Problem Solving  Group Date: 09/06/2022 Start Time: 0935 End Time: 1000 Facilitators: Zakayla Martinec-McCall, LRT,CTRS Location: [address] Dayroom   Goal Area(s) Addresses:  Patient will effectively work with peer towards shared goal.  Patient will identify skills used to make activity successful.  Patient will identify how skills used during activity can be used to reach post d/c goals.   Group Description: Landing Pad. In teams of 3-5, patients were given 12 plastic drinking straws and an equal length of masking tape. Using the materials provided, patients were asked to build a landing pad to catch a golf ball dropped from approximately 5 feet in the air. All materials were required to be used by the team in their design. LRT facilitated post-activity discussion.   Affect/Mood: Anxious   Participation Level: None    Clinical Observations/Individualized Feedback: Pt did not participate.  Pt was anxiously waiting to be discharged.  Pt was social with peers and observant.    Plan: Continue to engage patient in RT group sessions 2-3x/week.   Stanislaw Acton-McCall, LRT,CTRS 09/06/2022 12:38 PM

## 2022-09-06 NOTE — Progress Notes (Signed)
   09/05/22 2200  Psych Admission Type (Psych Patients Only)  Admission Status Voluntary  Psychosocial Assessment  Patient Complaints Hyperactivity  Eye Contact Fair  Facial Expression Animated  Affect Appropriate to circumstance  Speech Logical/coherent  Interaction Assertive  Motor Activity Other (Comment)  Appearance/Hygiene Unremarkable  Behavior Characteristics Cooperative  Mood Pleasant  Thought Process  Coherency WDL  Content WDL  Delusions None reported or observed  Perception WDL  Hallucination None reported or observed  Judgment WDL  Confusion None  Danger to Self  Current suicidal ideation? Denies  Agreement Not to Harm Self Yes  Description of Agreement verbal  Danger to Others  Danger to Others None reported or observed

## 2022-09-06 NOTE — Discharge Summary (Signed)
Physician Discharge Summary Note  Patient Identification: James Mclaughlin, 22 y.o. male  MRN: [id number] DOB: 2000-09-22  Date of Evaluation: 09/06/2022 Bed: 0402/0402-02 Patient phone: [phone] (home)  Patient address:   [address] [place] [address]  Date of Admission: 09/02/2022 Date of Discharge: 09/06/2022  Reason for Admission:   James Mclaughlin is a 22 y.o. male with a history of ADHD and asthma, no inpatient psych admission, no suicide attempt, no NSSIB, who was initially admitted for inpatient psychiatric hospitalization on 09/02/2022 for management of SI in the setting of trauma that occurred 2 years ago for which he did not receive psychiatric evaluation. This is patient's first inpatient psych admission Total duration of encounter: 4 days   Past Psychiatric History:  Previous Psych Diagnoses: personal h/o ADHD Prior psychiatric treatment: ADHD rx, doesn't know which one, Dc'd in 8th grade Psychiatric medication compliance history: N/A Current psychiatric treatment: None Current psychiatrist: None Current therapist: None   Previous hospitalizations: none History of suicide attempts: None History of self harm: None   Substance Use History: Alcohol: One bootleg every other month  Hx withdrawal tremors/shakes: None Hx alcohol related blackouts None Hx alcohol induced hallucinations: None Hx alcoholic seizures: None DUI: None   Past Medical/Surgical History:  Medical Diagnoses: ADHD, asthma Home Rx: Inhaler PRN  Prior Hosp: Hospitalized around 22 y.o. for "heart murmur"  Prior Surgeries / non-head trauma: None   Head trauma: Concussion from playing football  Migraines:None Seizures: None   Allergies: Pollen, bee/wasp stings    Family History:  Medical: Heart disease in maternal and paternal side Psych: Mother has depression and anxiety  Psych Rx: Unsure Suicide: None Substance use family hx: maternal grandmother - EtOH abuse    Social History:  Place of  birth and grew up where: [place] Abuse: None Marital Status: Single Children: Step son 48.61 years old Employment: Press photographer: Graduated high school Housing: Lives with mother, two sisters, and brother Finances: N/A Legal: None Military: N/A Weapons: No access to firearm  Pills stockpile: None   Tobacco: None Marijuana: Smokes 2x/week, Vapes nicotine daily (using up a cartridge every 2 weeks) Cocaine: None Methamphetamines: None Ecstasy: None Opiates: None Benzodiazepines: None Prescribed Meds abuse: None   History of Detox / Rehab: None  Collateral from 09-05-2022: Roena Malady [phone]): From her perspective, patient has been "doing ok," and has not endorsed further SI. She has no safety concerns about patient returning home tomorrow. She would like to pick him up tomorrow around noon.  She is appreciative of the call.     Collateral from 09-04-22: Girlfriend, Colin Mulders [phone]: The only thing reported to her was that he wanted therapy. She says he never told her he has had thoughts of harm to himself. He did not have an ED visit last week in which he reported SI. No recent changes to mood or behaviors. To her knowledge, he uses vape but no other tobacco, alcohol, or substance use. She believes that he is safe. No access to guns or weapons at home to her knowledge. She says that they have open and honest communication about mental health. She spoke to the patient just prior to this writer calling her.    Collateral from 09-02-22: Hessie Knows (Mother) [phone] [facility])  Mom stated that patient did not do CPR on grandmother. Mom reported that the patient did not go to the ED last week for himself, rather he was accompanying a male friend to the ED because she had a stomach bug.  On wednesday, the day he presented [facility] ED, mom stated that patient was in a good mood, being with friends and sister. Appeared to be at baseline. Then later that evening  patient texted mom, saying that he was suicidal, and no further information. Mom felt that patient is with-holding how patient really feels and his sxs.  Patient would ball up at night and cry at times because patient felt that he was a burden to mom.  Mom reported that she does not have any safety concerns, believes the patient would not have killed himself despite SI.  Mom is unsure of what prompted the SI.  Mom reported that she believes that he has depression, however does not think it is so severe that he would harm himself.  Rather because he endorsed SI, mom wanted to take that thought seriously. Principal Problem: PTSD (post-traumatic stress disorder) Discharge Diagnoses: Principal Problem:   PTSD (post-traumatic stress disorder) Active Problems:   Tobacco use disorder   Past Psychiatric History: Per above Past Medical History:  Past Medical History:  Diagnosis Date   Asthma    History reviewed. No pertinent surgical history. Family History:  Family History  Problem Relation Age of Onset   Healthy Mother    Healthy Father    Family Psychiatric  History: Per above Social History:  Social History   Substance and Sexual Activity  Alcohol Use Never    Social History   Substance and Sexual Activity  Drug Use Never    Social History   Socioeconomic History   Marital status: Single    Spouse name: Not on file   Number of children: Not on file   Years of education: Not on file   Highest education level: Not on file  Occupational History   Not on file  Tobacco Use   Smoking status: Never   Smokeless tobacco: Never  Vaping Use   Vaping Use: Every day  Substance and Sexual Activity   Alcohol use: Never   Drug use: Never   Sexual activity: Yes  Other Topics Concern   Not on file  Social History Narrative   Not on file   Social Determinants of Health   Financial Resource Strain: Not on file  Food Insecurity: No Food Insecurity (09/02/2022)   Hunger Vital Sign     Worried About Running Out of Food in the Last Year: Never true    Ran Out of Food in the Last Year: Never true  Transportation Needs: No Transportation Needs (09/02/2022)   PRAPARE - Hydrologist (Medical): No    Lack of Transportation (Non-Medical): No  Physical Activity: Not on file  Stress: Not on file  Social Connections: Not on file    Hospital Course:   During the patient's hospitalization, patient had extensive initial psychiatric evaluation, and follow-up psychiatric evaluations every day.  Psychiatric diagnoses provided upon initial assessment:  Principal Problem:   PTSD (post-traumatic stress disorder) Active Problems:   Tobacco use disorder  Patient's psychiatric medications were adjusted on admission:  Start SSRI for PTSD symptoms with sertraline 25 mg on 2/1, plan to increase to 50 mg on 2/2   During the hospitalization, other adjustments were made to the patient's psychiatric medication regimen:  Up titrated zoloft 25 mg to 50 mg daily  During the hospitalization, patient's work-up:  Body mass index is 29.76 kg/m. UDS negative Ethanol, salicylate, and acetaminophen negative  CMP: AST 32, ALT 50, total Bili 1.3, , otherwise  wnl CBC: Hg 17.3, , otherwise wnl Lipid panel showed LDL 106, otherwise wnl WNL: TSH, A1c  Patient's care was discussed during the interdisciplinary team meeting every day during the hospitalization.  Patient's side effects to prescribed psychiatric medications: transient fatigue, self resolved. Some activating side effects  Assessment  Gradually, patient started adjusting to milieu. The patient was evaluated each day by a clinical provider to ascertain response to treatment. Improvement was noted by the patient's report of decreasing symptoms, improved sleep and appetite, affect, medication tolerance, behavior, and participation in unit programming.  Patient was asked each day to complete a self inventory noting  mood, mental status, pain, new symptoms, anxiety and concerns.    Symptoms were reported as significantly decreased or resolved completely by discharge.   On day of discharge, the patient reports that their mood is stable. The patient denied having suicidal thoughts for more than 48 hours prior to discharge.  Patient denies having homicidal thoughts. Patient denies having auditory hallucinations. Patient denies any visual hallucinations or other symptoms of psychosis. The patient was motivated to continue taking medication with a goal of continued improvement in mental health.   The patient reports their target psychiatric symptoms of suicidal ideation, mood lability, depression, anxiety responded well to the psychiatric medications, and the patient reports overall benefit other psychiatric hospitalization. Supportive psychotherapy was provided to the patient. The patient also participated in regular group therapy while hospitalized. Coping skills, problem solving as well as relaxation therapies were also part of the unit programming.  Labs were reviewed with the patient, and abnormal results were discussed with the patient.  The patient is able to verbalize their individual safety plan to this provider.  While future psychiatric events cannot be accurately predicted, the patient does not currently require acute inpatient psychiatric care and does not currently meet [facility] involuntary commitment criteria.  Behavioral Events: None. Pleasant, calm, cooperative  Restraints: None  Groups: attended appropriately, engaged  # It is recommended to the patient to continue psychiatric medications as prescribed, after discharge from the hospital.    # It is recommended to the patient to follow up with your outpatient psychiatric provider and PCP.  # It was discussed with the patient, the impact of alcohol, drugs, tobacco have been there overall psychiatric and medical wellbeing, and total  abstinence from substance use was recommended to the patient.  # Prescriptions provided or sent directly to preferred pharmacy at discharge. Patient agreeable to plan. Given opportunity to ask questions. Appears to feel comfortable with discharge.    # In the event of worsening symptoms, the patient is instructed to call the crisis hotline, 911 and or go to the nearest ED for appropriate evaluation and treatment of symptoms. To follow-up with primary care provider for other medical issues, concerns and or health care needs  # Patient was discharged HOME  with a plan to follow up as noted below.  Is patient on multiple antipsychotic therapies at discharge:  No   Has Patient had three or more failed trials of antipsychotic monotherapy by history:  No Recommended Plan for Multiple Antipsychotic Therapies: NA Tobacco Cessation:  A prescription for an FDA-approved tobacco cessation medication provided at discharge  Physical Findings: BP 113/63 (BP Location: Right Arm)   Pulse (!) 131   Temp 98.4 F (36.9 C) (Oral)   Resp 16   Ht 5\' 7"  (1.702 m)   Wt 86.2 kg   SpO2 97%   BMI 29.76 kg/m   Repeat manual  HR 94  Physical Exam Vitals and nursing note reviewed.  Constitutional:      General: He is not in acute distress.    Appearance: He is not ill-appearing, toxic-appearing or diaphoretic.  HENT:     Head: Normocephalic.  Pulmonary:     Effort: Pulmonary effort is normal. No respiratory distress.  Neurological:     Mental Status: He is alert.    Review of Systems  Respiratory:  Negative for shortness of breath.   Cardiovascular:  Negative for chest pain.  Gastrointestinal:  Negative for nausea and vomiting.  Neurological:  Negative for dizziness and headaches.    Musculoskeletal: Strength & Muscle Tone: within normal limits Gait & Station: normal Patient leans: N/A   Presentation  General Appearance:Appropriate for Environment, Casual, Fairly Groomed Eye  Contact:Good Speech:Clear and Coherent, Normal Rate Volume:Normal Handedness:Right  Mood and Affect  Mood:Euthymic Affect:Appropriate, Congruent, Full Range  Thought Process  Thought Process:Coherent, Goal Directed, Linear Descriptions of Associations:Intact  Thought Content Suicidal Thoughts:Suicidal Thoughts: No Homicidal Thoughts:Homicidal Thoughts: No Hallucinations:Hallucinations: None Ideas of Reference:None Thought Content:Logical, WDL  Sensorium  Memory:Immediate Good Judgment:Fair Insight:Fair  Executive Functions  Orientation:Full (Time, Place and Person) Language:Good Concentration:Good Attention:Good Recall:Good Fund of Knowledge:Good  Psychomotor Activity  Psychomotor Activity:Psychomotor Activity: Normal  Assets  Assets:Communication Skills, Desire for Improvement, Resilience, Housing, Intimacy, Leisure Time, Physical Health, Social Support, Talents/Skills, Transportation, Vocational/Educational  Sleep  Quality:Good  Social History   Tobacco Use  Smoking Status Never  Smokeless Tobacco Never    Blood Alcohol level:  Lab Results  Component Value Date   ETH <10 09/01/2022    Metabolic Disorder Labs:  Lab Results  Component Value Date   HGBA1C 5.0 09/03/2022   MPG 96.8 09/03/2022   No results found for: "PROLACTIN" Lab Results  Component Value Date   CHOL 183 09/03/2022   TRIG 49 09/03/2022   HDL 67 09/03/2022   CHOLHDL 2.7 09/03/2022   VLDL 10 09/03/2022   LDLCALC 106 (H) 09/03/2022    Discharge destination: See above  Discharge Instructions     Diet - low sodium heart healthy   Complete by: As directed    Increase activity slowly   Complete by: As directed       Allergies as of 09/06/2022       Reactions   Other Anaphylaxis   Uncoded Allergy. Allergen: bees        Medication List     STOP taking these medications    dicyclomine 20 MG tablet Commonly known as: BENTYL   doxycycline 50 MG capsule Commonly  known as: VIBRAMYCIN   ketorolac 10 MG tablet Commonly known as: TORADOL   ondansetron 4 MG disintegrating tablet Commonly known as: ZOFRAN-ODT       TAKE these medications      Indication  albuterol 108 (90 Base) MCG/ACT inhaler Commonly known as: VENTOLIN HFA Inhale 1-2 puffs into the lungs every 6 (six) hours as needed for wheezing or shortness of breath.  Indication: Asthma   hydrOXYzine 25 MG tablet Commonly known as: ATARAX Take 1 tablet (25 mg total) by mouth 3 (three) times daily as needed for anxiety.  Indication: Feeling Anxious   ipratropium 0.06 % nasal spray Commonly known as: ATROVENT Place 2 sprays into both nostrils 4 (four) times daily as needed for rhinitis.  Indication: Runny Nose associated with a Cold, Hayfever   nicotine polacrilex 4 MG gum Commonly known as: NICORETTE Take 1 each (4 mg total) by mouth as needed for  smoking cessation (Last gum dose to be given no later than 6:30 PM to avoid difficulties sleeping).  Indication: Nicotine Addiction   sertraline 50 MG tablet Commonly known as: ZOLOFT Take 1 tablet (50 mg total) by mouth daily. Start taking on: September 07, 2022  Indication: Posttraumatic Stress Disorder        Follow-up Information     [facility] on 09/10/2022.   Why: You have a hospital follow up appointment on 09/10/22 at 11:00 am.   This appointment will be held in person.  Following this appointment, you will be scheduled for a clinical assessment, to obtain therapy and medication management services. Contact information: [address] [place] [address] [phone]                 Discharge recommendations:  Activity: as tolerated Diet: heart healthy  # It is recommended to the patient to continue psychiatric medications as prescribed, after discharge from the hospital.     # It is recommended to the patient to follow up with your outpatient psychiatric provider -instructions on  appointment date, time, and address (location) are provided to you in discharge paperwork  # Follow-up with outpatient primary care doctor and other specialists -for management of chronic medical disease, including:  N/A   # Testing: Follow-up with outpatient provider for abnormal lab results:  See above   # It was discussed with the patient, the impact of alcohol, drugs, tobacco have been there overall psychiatric and medical wellbeing, and total abstinence from substance use was recommended to the patient.   # Prescriptions provided or sent directly to preferred pharmacy at discharge. Patient agreeable to plan. Given opportunity to ask questions. Appears to feel comfortable with discharge.    # In the event of worsening symptoms, the patient is instructed to call the crisis hotline, 911 and or go to the nearest ED for appropriate evaluation and treatment of symptoms. To follow-up with primary care provider for other medical issues, concerns and or health care needs  Total Time Spent in Direct Patient Care: See attending attestation.  Signed: [name], DO Psychiatry Resident, PGY-2 [facility] - Adult  [address] [place], [place] [address] Ph: [phone] Fax: [phone] 09/06/2022, 9:42 AM

## 2022-09-06 NOTE — Plan of Care (Signed)
  Problem: Education: Goal: Knowledge of [facility] Education information/materials will improve Outcome: Completed/Met Goal: Emotional status will improve Outcome: Completed/Met Goal: Mental status will improve Outcome: Completed/Met Goal: Verbalization of understanding the information provided will improve Outcome: Completed/Met   Problem: Activity: Goal: Interest or engagement in activities will improve Outcome: Completed/Met Goal: Sleeping patterns will improve Outcome: Completed/Met   Problem: Coping: Goal: Ability to verbalize frustrations and anger appropriately will improve Outcome: Completed/Met Goal: Ability to demonstrate self-control will improve Outcome: Completed/Met   

## 2022-09-06 NOTE — Progress Notes (Signed)
  [facility] Adult Case Management Discharge Plan :  Will you be returning to the same living situation after discharge:  Yes,  Home  At discharge, do you have transportation home?: Yes,  Mother  Do you have the ability to pay for your medications: Yes,  Medicaid   Release of information consent forms completed and in the chart;  Patient's signature needed at discharge.  Patient to Follow up at:  Follow-up Information     [facility] on 09/10/2022.   Why: You have a hospital follow up appointment on 09/10/22 at 11:00 am.   This appointment will be held in person.  Following this appointment, you will be scheduled for a clinical assessment, to obtain therapy and medication management services. Contact information: [place] [address] [phone]                 Next level of care provider has access to [facility] and Suicide Prevention discussed: Yes,  with patient and mother      Has patient been referred to the Quitline?: N/A patient is not a smoker  Patient has been referred for addiction treatment: N/A  Darleen Crocker, [place] 09/06/2022, 9:35 AM

## 2022-09-16 ENCOUNTER — Emergency Department
Admission: EM | Admit: 2022-09-16 | Discharge: 2022-09-16 | Disposition: A | Discharge status: Home / Self Care | Payer: Medicaid Other | Attending: Emergency Medicine | Admitting: Emergency Medicine

## 2022-09-16 ENCOUNTER — Other Ambulatory Visit: Payer: Self-pay

## 2022-09-16 ENCOUNTER — Emergency Department: Payer: Medicaid Other

## 2022-09-16 DIAGNOSIS — E876 Hypokalemia: Secondary | ICD-10-CM

## 2022-09-16 DIAGNOSIS — R6889 Other general symptoms and signs: Secondary | ICD-10-CM

## 2022-09-16 DIAGNOSIS — R109 Unspecified abdominal pain: Secondary | ICD-10-CM | POA: Insufficient documentation

## 2022-09-16 DIAGNOSIS — U071 COVID-19: Secondary | ICD-10-CM

## 2022-09-16 DIAGNOSIS — R059 Cough, unspecified: Secondary | ICD-10-CM | POA: Diagnosis present

## 2022-09-16 LAB — URINALYSIS, ROUTINE W REFLEX MICROSCOPIC
Bilirubin Urine: NEGATIVE
Glucose, UA: NEGATIVE mg/dL
Hgb urine dipstick: NEGATIVE
Ketones, ur: NEGATIVE mg/dL
Leukocytes,Ua: NEGATIVE
Nitrite: NEGATIVE
Protein, ur: NEGATIVE mg/dL
Specific Gravity, Urine: 1.013 (ref 1.005–1.030)
pH: 7 (ref 5.0–8.0)

## 2022-09-16 LAB — RESP PANEL BY RT-PCR (RSV, FLU A&B, COVID)  RVPGX2
Influenza A by PCR: NEGATIVE
Influenza B by PCR: NEGATIVE
Resp Syncytial Virus by PCR: NEGATIVE
SARS Coronavirus 2 by RT PCR: POSITIVE — AB

## 2022-09-16 LAB — TROPONIN I (HIGH SENSITIVITY): Troponin I (High Sensitivity): <2 ng/L (ref <18)

## 2022-09-16 LAB — BASIC METABOLIC PANEL
Anion gap: 7 (ref 5–15)
BUN: 9 mg/dL (ref 6–20)
CO2: 28 mmol/L (ref 22–32)
Calcium: 8.6 mg/dL — ABNORMAL LOW (ref 8.9–10.3)
Chloride: 100 mmol/L (ref 98–111)
Creatinine, Ser: 1 mg/dL (ref 0.61–1.24)
GFR, Estimated: >60 mL/min (ref >60)
Glucose, Bld: 103 mg/dL — ABNORMAL HIGH (ref 70–99)
Potassium: 3.2 mmol/L — ABNORMAL LOW (ref 3.5–5.1)
Sodium: 135 mmol/L (ref 135–145)

## 2022-09-16 LAB — CBC
HCT: 48.4 % (ref 39.0–52.0)
Hemoglobin: 16.7 g/dL (ref 13.0–17.0)
MCH: 29.5 pg (ref 26.0–34.0)
MCHC: 34.5 g/dL (ref 30.0–36.0)
MCV: 85.5 fL (ref 80.0–100.0)
Platelets: 260 10*3/uL (ref 150–400)
RBC: 5.66 MIL/uL (ref 4.22–5.81)
RDW: 12.2 % (ref 11.5–15.5)
WBC: 12.5 10*3/uL — ABNORMAL HIGH (ref 4.0–10.5)
nRBC: 0 % (ref 0.0–0.2)

## 2022-09-16 LAB — LIPASE, BLOOD: Lipase: 37 U/L (ref 11–51)

## 2022-09-16 MED ORDER — IBUPROFEN 600 MG PO TABS: 600.0000 mg | ORAL_TABLET | Freq: Four times a day (QID) | ORAL | 0 refills | Status: AC | PRN

## 2022-09-16 MED ORDER — ONDANSETRON 4 MG PO TBDP
4.0000 mg | ORAL_TABLET | Freq: Once | ORAL | Status: AC
  Administered 2022-09-16: 4 mg via ORAL
  Filled 2022-09-16: qty 1

## 2022-09-16 MED ORDER — ACETAMINOPHEN 500 MG PO TABS
1000.0000 mg | ORAL_TABLET | Freq: Once | ORAL | Status: AC
  Administered 2022-09-16: 1000 mg via ORAL
  Filled 2022-09-16: qty 2

## 2022-09-16 MED ORDER — PROMETHAZINE-DM 6.25-15 MG/5ML PO SYRP: 5.0000 mL | ORAL_SOLUTION | Freq: Four times a day (QID) | ORAL | 0 refills | Status: AC | PRN

## 2022-09-16 MED ORDER — POTASSIUM CHLORIDE CRYS ER 20 MEQ PO TBCR
10.0000 meq | EXTENDED_RELEASE_TABLET | Freq: Once | ORAL | Status: AC
  Administered 2022-09-16: 10 meq via ORAL
  Filled 2022-09-16: qty 1

## 2022-09-16 MED ORDER — IBUPROFEN 800 MG PO TABS
800.0000 mg | ORAL_TABLET | Freq: Once | ORAL | Status: AC
  Administered 2022-09-16: 800 mg via ORAL
  Filled 2022-09-16: qty 1

## 2022-09-16 NOTE — ED Triage Notes (Signed)
Pt states he was exposed to the flu and has chest pain and vomited twice and saw some blood in his vomit. Pt state he also has abdominal pain.

## 2022-09-16 NOTE — Discharge Instructions (Addendum)
Please Tylenol and ibuprofen as needed for fevers chills and body aches.  Take cough medication as prescribed.  Make sure you are drinking lots of fluids and staying hydrated.  Return to the ER for any fevers above 101, worsening cough, chest pain shortness of breath or any urgent changes in health

## 2022-09-16 NOTE — ED Provider Notes (Addendum)
[facility] Provider Note   CSN: [id number] Arrival date & time: 09/16/22  1809     History  Chief Complaint  Patient presents with   Chest Pain    James Mclaughlin is a 22 y.o. male.  Presents to the emergency department for evaluation of flulike symptoms.  Patient states he was exposed to the flu has had some nausea vomiting belly pain, cough, chest pain runny nose and sore throat.  He also describes fevers chills and headaches.  He has not any medications for symptoms.  All symptoms began earlier today  HPI     Home Medications Prior to Admission medications   Medication Sig Start Date End Date Taking? Authorizing Provider  ibuprofen (ADVIL) 600 MG tablet Take 1 tablet (600 mg total) by mouth every 6 (six) hours as needed for moderate pain, fever or headache. 09/16/22  Yes Duanne Guess, PA-C  promethazine-dextromethorphan (PROMETHAZINE-DM) 6.25-15 MG/5ML syrup Take 5 mLs by mouth 4 (four) times daily as needed for cough. 09/16/22  Yes Duanne Guess, PA-C  albuterol (VENTOLIN HFA) 108 (90 Base) MCG/ACT inhaler Inhale 1-2 puffs into the lungs every 6 (six) hours as needed for wheezing or shortness of breath. 09/06/22   [name], [name], MD  hydrOXYzine (ATARAX) 25 MG tablet Take 1 tablet (25 mg total) by mouth 3 (three) times daily as needed for anxiety. 09/06/22   [name], [name], MD  ipratropium (ATROVENT) 0.06 % nasal spray Place 2 sprays into both nostrils 4 (four) times daily as needed for rhinitis. Patient not taking: Reported on 09/01/2022 04/08/20   Coral Spikes, DO  nicotine polacrilex (NICORETTE) 4 MG gum Take 1 each (4 mg total) by mouth as needed for smoking cessation (Last gum dose to be given no later than 6:30 PM to avoid difficulties sleeping). 09/06/22   [name], [name], MD  sertraline (ZOLOFT) 50 MG tablet Take 1 tablet (50 mg total) by mouth daily. 09/07/22 10/07/22  MassengillOvid Curd, MD      Allergies    Other     Review of Systems   Review of Systems  Physical Exam Updated Vital Signs BP 139/87 (BP Location: Left Arm)   Pulse 96   Temp 98.8 F (37.1 C) (Oral)   Resp 20   Ht 5' 9"$  (1.753 m)   Wt 77.1 kg   SpO2 99%   BMI 25.10 kg/m  Physical Exam Constitutional:      General: He is not in acute distress.    Appearance: He is well-developed.  HENT:     Head: Normocephalic and atraumatic.     Jaw: No trismus.     Right Ear: Hearing, tympanic membrane, ear canal and external ear normal.     Left Ear: Hearing, tympanic membrane, ear canal and external ear normal.     Nose: Rhinorrhea present.     Right Sinus: No maxillary sinus tenderness or frontal sinus tenderness.     Left Sinus: No maxillary sinus tenderness or frontal sinus tenderness.     Mouth/Throat:     Pharynx: No oropharyngeal exudate, posterior oropharyngeal erythema or uvula swelling.     Tonsils: No tonsillar abscesses.  Eyes:     Conjunctiva/sclera: Conjunctivae normal.  Cardiovascular:     Rate and Rhythm: Normal rate and regular rhythm.     Pulses: Normal pulses.     Heart sounds: Normal heart sounds.  Pulmonary:     Effort: Pulmonary effort is normal. No respiratory distress.  Breath sounds: No stridor. No wheezing.  Chest:     Chest wall: No tenderness.  Abdominal:     General: There is no distension.     Palpations: Abdomen is soft.     Tenderness: There is no abdominal tenderness. There is no right CVA tenderness, left CVA tenderness or guarding.  Musculoskeletal:        General: Normal range of motion.     Cervical back: Normal range of motion.  Skin:    General: Skin is warm and dry.     Findings: No rash.  Neurological:     General: No focal deficit present.     Mental Status: He is alert and oriented to person, place, and time.     Cranial Nerves: No cranial nerve deficit.  Psychiatric:        Mood and Affect: Mood normal.        Behavior: Behavior normal.        Thought Content: Thought  content normal.        Judgment: Judgment normal.     ED Results / Procedures / Treatments   Labs (all labs ordered are listed, but only abnormal results are displayed) Labs Reviewed  RESP PANEL BY RT-PCR (RSV, FLU A&B, COVID)  RVPGX2 - Abnormal; Notable for the following components:      Result Value   SARS Coronavirus 2 by RT PCR POSITIVE (*)    All other components within normal limits  BASIC METABOLIC PANEL - Abnormal; Notable for the following components:   Potassium 3.2 (*)    Glucose, Bld 103 (*)    Calcium 8.6 (*)    All other components within normal limits  CBC - Abnormal; Notable for the following components:   WBC 12.5 (*)    All other components within normal limits  URINALYSIS, ROUTINE W REFLEX MICROSCOPIC - Abnormal; Notable for the following components:   Color, Urine YELLOW (*)    APPearance CLEAR (*)    All other components within normal limits  LIPASE, BLOOD  TROPONIN I (HIGH SENSITIVITY)  TROPONIN I (HIGH SENSITIVITY)    EKG None  Radiology DG Chest 2 View  Result Date: 09/16/2022 CLINICAL DATA:  Chest pain EXAM: CHEST - 2 VIEW COMPARISON:  06/17/2022 and older FINDINGS: The heart size and mediastinal contours are within normal limits. Both lungs are clear. The visualized skeletal structures are unremarkable. IMPRESSION: No active cardiopulmonary disease. Electronically Signed   By: Jill Side M.D.   On: 09/16/2022 19:01    Procedures Procedures    Medications Ordered in ED Medications  ondansetron (ZOFRAN-ODT) disintegrating tablet 4 mg (4 mg Oral Given 09/16/22 2154)  ibuprofen (ADVIL) tablet 800 mg (800 mg Oral Given 09/16/22 2154)  acetaminophen (TYLENOL) tablet 1,000 mg (1,000 mg Oral Given 09/16/22 2154)    ED Course/ Medical Decision Making/ A&P                             Medical Decision Making Risk OTC drugs. Prescription drug management.    Final Clinical Impression(s) / ED Diagnoses Final diagnoses:  COVID-19  Flu-like  symptoms  22 year old male with flulike symptoms.  He is positive for COVID-19.  Patient with no past medical history.  Today's vital signs are stable, afebrile nontachycardic.  Has had nausea vomiting with cough and cold symptoms.  His blood work shows slight elevated white count of 12.5, normal troponin, BMP within normal limits except for slightly  low potassium of 3.9.  Patient had normal EKG and normal chest x-ray.  Patient symptoms consistent with COVID-19.  He is given Tylenol/ibuprofen and Zofran.  He will increase fluids.  He is discharged home with dextromethorphan Phenergan and he will alternate Tylenol and ibuprofen as needed for chills body aches and fevers.  He understands signs and symptoms return to the ER for.  He will quarantine.  Patient will eat a diet high in potassium to help supplement potassium  Rx / DC Orders ED Discharge Orders          Ordered    promethazine-dextromethorphan (PROMETHAZINE-DM) 6.25-15 MG/5ML syrup  4 times daily PRN        09/16/22 2208    ibuprofen (ADVIL) 600 MG tablet  Every 6 hours PRN        09/16/22 2208              Duanne Guess, PA-C 09/16/22 2215    Duanne Guess, PA-C 09/16/22 2215    Harvest Dark, MD 09/17/22 1924

## 2022-09-21 ENCOUNTER — Other Ambulatory Visit: Payer: Self-pay

## 2022-09-21 ENCOUNTER — Emergency Department: Payer: Medicaid Other

## 2022-09-21 DIAGNOSIS — S8992XA Unspecified injury of left lower leg, initial encounter: Secondary | ICD-10-CM | POA: Insufficient documentation

## 2022-09-21 DIAGNOSIS — Y9367 Activity, basketball: Secondary | ICD-10-CM | POA: Insufficient documentation

## 2022-09-21 DIAGNOSIS — X58XXXA Exposure to other specified factors, initial encounter: Secondary | ICD-10-CM | POA: Diagnosis not present

## 2022-09-21 DIAGNOSIS — Z5321 Procedure and treatment not carried out due to patient leaving prior to being seen by health care provider: Secondary | ICD-10-CM | POA: Diagnosis not present

## 2022-09-21 DIAGNOSIS — S6991XA Unspecified injury of right wrist, hand and finger(s), initial encounter: Secondary | ICD-10-CM | POA: Insufficient documentation

## 2022-09-21 NOTE — ED Triage Notes (Signed)
Pt arrived via POV with reports of L knee injury R index finger injury while playing basketball tonight. Swelling noted to both.

## 2022-09-22 ENCOUNTER — Emergency Department
Admission: EM | Admit: 2022-09-22 | Discharge: 2022-09-22 | Discharge status: Against Medical Advice | Payer: Medicaid Other | Attending: Student in an Organized Health Care Education/Training Program | Admitting: Student in an Organized Health Care Education/Training Program

## 2022-09-22 NOTE — ED Notes (Signed)
No answer when called several times from lobby 

## 2022-09-28 ENCOUNTER — Other Ambulatory Visit: Payer: Self-pay

## 2022-09-28 ENCOUNTER — Emergency Department
Admission: EM | Admit: 2022-09-28 | Discharge: 2022-09-28 | Disposition: A | Discharge status: Home / Self Care | Payer: Medicaid Other | Attending: Emergency Medicine | Admitting: Emergency Medicine

## 2022-09-28 ENCOUNTER — Emergency Department: Payer: Medicaid Other

## 2022-09-28 ENCOUNTER — Encounter: Payer: Self-pay | Admitting: Emergency Medicine

## 2022-09-28 DIAGNOSIS — S20219A Contusion of unspecified front wall of thorax, initial encounter: Secondary | ICD-10-CM | POA: Insufficient documentation

## 2022-09-28 DIAGNOSIS — Y92410 Unspecified street and highway as the place of occurrence of the external cause: Secondary | ICD-10-CM | POA: Insufficient documentation

## 2022-09-28 DIAGNOSIS — M25571 Pain in right ankle and joints of right foot: Secondary | ICD-10-CM | POA: Diagnosis not present

## 2022-09-28 DIAGNOSIS — R079 Chest pain, unspecified: Secondary | ICD-10-CM | POA: Diagnosis present

## 2022-09-28 MED ORDER — IBUPROFEN 600 MG PO TABS
600.0000 mg | ORAL_TABLET | Freq: Once | ORAL | Status: AC
  Administered 2022-09-28: 600 mg via ORAL
  Filled 2022-09-28: qty 1

## 2022-09-28 NOTE — Discharge Instructions (Signed)
Your x-rays are normal.  Please rest, ice, elevate your ankle.  May continue to take Tylenol/ibuprofen per package instructions to help with your symptoms.  Please return for any new, worsening, or change in symptoms or other concerns.  It was a pleasure caring for you today.
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
## 2022-10-12 ENCOUNTER — Ambulatory Visit (LOCAL_COMMUNITY_HEALTH_CENTER): Payer: Self-pay

## 2022-10-12 DIAGNOSIS — Z111 Encounter for screening for respiratory tuberculosis: Secondary | ICD-10-CM

## 2022-10-12 NOTE — Progress Notes (Signed)
Here for PPD. Employer Reita Cliche is [employer] is paying for ppd. Form sent for scanning. Josie Saunders, RN

## 2022-10-15 ENCOUNTER — Ambulatory Visit (LOCAL_COMMUNITY_HEALTH_CENTER): Payer: Medicaid Other

## 2022-10-15 DIAGNOSIS — Z111 Encounter for screening for respiratory tuberculosis: Secondary | ICD-10-CM

## 2022-10-15 LAB — TB SKIN TEST
Induration: 8 mm
TB Skin Test: NEGATIVE

## 2022-10-15 NOTE — Progress Notes (Signed)
Patient here in clinic for [place].Patient at 8 mm negative denies TB risk factors. TB completed form given. Servando Salina, RN

## 2022-11-19 ENCOUNTER — Other Ambulatory Visit: Payer: Self-pay

## 2022-11-19 ENCOUNTER — Emergency Department: Payer: Medicaid Other

## 2022-11-19 ENCOUNTER — Emergency Department
Admission: EM | Admit: 2022-11-19 | Discharge: 2022-11-19 | Disposition: A | Discharge status: Home / Self Care | Payer: Medicaid Other | Attending: Emergency Medicine | Admitting: Emergency Medicine

## 2022-11-19 DIAGNOSIS — R079 Chest pain, unspecified: Secondary | ICD-10-CM | POA: Diagnosis present

## 2022-11-19 DIAGNOSIS — B349 Viral infection, unspecified: Secondary | ICD-10-CM

## 2022-11-19 DIAGNOSIS — R1084 Generalized abdominal pain: Secondary | ICD-10-CM | POA: Insufficient documentation

## 2022-11-19 DIAGNOSIS — Z1152 Encounter for screening for COVID-19: Secondary | ICD-10-CM | POA: Insufficient documentation

## 2022-11-19 LAB — URINALYSIS, W/ REFLEX TO CULTURE (INFECTION SUSPECTED)
Bilirubin Urine: NEGATIVE
Glucose, UA: NEGATIVE mg/dL
Hgb urine dipstick: NEGATIVE
Ketones, ur: NEGATIVE mg/dL
Leukocytes,Ua: NEGATIVE
Nitrite: NEGATIVE
Protein, ur: NEGATIVE mg/dL
Specific Gravity, Urine: 1.014 (ref 1.005–1.030)
pH: 7 (ref 5.0–8.0)

## 2022-11-19 LAB — HEPATIC FUNCTION PANEL
ALT: 22 U/L (ref 0–44)
AST: 25 U/L (ref 15–41)
Albumin: 4 g/dL (ref 3.5–5.0)
Alkaline Phosphatase: 59 U/L (ref 38–126)
Bilirubin, Direct: 0.2 mg/dL (ref 0.0–0.2)
Indirect Bilirubin: 0.7 mg/dL (ref 0.3–0.9)
Total Bilirubin: 0.9 mg/dL (ref 0.3–1.2)
Total Protein: 6.4 g/dL — ABNORMAL LOW (ref 6.5–8.1)

## 2022-11-19 LAB — SARS CORONAVIRUS 2 BY RT PCR: SARS Coronavirus 2 by RT PCR: NEGATIVE

## 2022-11-19 LAB — CBC
HCT: 49 % (ref 39.0–52.0)
Hemoglobin: 17.3 g/dL — ABNORMAL HIGH (ref 13.0–17.0)
MCH: 30.8 pg (ref 26.0–34.0)
MCHC: 35.3 g/dL (ref 30.0–36.0)
MCV: 87.2 fL (ref 80.0–100.0)
Platelets: 262 10*3/uL (ref 150–400)
RBC: 5.62 MIL/uL (ref 4.22–5.81)
RDW: 12.5 % (ref 11.5–15.5)
WBC: 7.8 10*3/uL (ref 4.0–10.5)
nRBC: 0 % (ref 0.0–0.2)

## 2022-11-19 LAB — BASIC METABOLIC PANEL
Anion gap: 7 (ref 5–15)
BUN: 15 mg/dL (ref 6–20)
CO2: 28 mmol/L (ref 22–32)
Calcium: 9.2 mg/dL (ref 8.9–10.3)
Chloride: 102 mmol/L (ref 98–111)
Creatinine, Ser: 1.07 mg/dL (ref 0.61–1.24)
GFR, Estimated: >60 mL/min (ref >60)
Glucose, Bld: 105 mg/dL — ABNORMAL HIGH (ref 70–99)
Potassium: 3.6 mmol/L (ref 3.5–5.1)
Sodium: 137 mmol/L (ref 135–145)

## 2022-11-19 LAB — TROPONIN I (HIGH SENSITIVITY)
Troponin I (High Sensitivity): <2 ng/L (ref <18)
Troponin I (High Sensitivity): 4 ng/L (ref <18)

## 2022-11-19 LAB — LIPASE, BLOOD: Lipase: 33 U/L (ref 11–51)

## 2022-11-19 MED ORDER — IOHEXOL 300 MG/ML  SOLN
100.0000 mL | Freq: Once | INTRAMUSCULAR | Status: AC | PRN
  Administered 2022-11-19: 100 mL via INTRAVENOUS

## 2022-11-19 MED ORDER — SODIUM CHLORIDE 0.9 % IV BOLUS
1000.0000 mL | Freq: Once | INTRAVENOUS | Status: AC
  Administered 2022-11-19: 1000 mL via INTRAVENOUS

## 2022-11-19 MED ORDER — ONDANSETRON 4 MG PO TBDP: 4.0000 mg | ORAL_TABLET | Freq: Three times a day (TID) | ORAL | 0 refills | Status: DC | PRN

## 2022-11-19 MED ORDER — LOPERAMIDE HCL 2 MG PO TABS: 2.0000 mg | ORAL_TABLET | Freq: Four times a day (QID) | ORAL | 0 refills | Status: AC | PRN

## 2022-11-19 MED ORDER — FLUTICASONE PROPIONATE 50 MCG/ACT NA SUSP: 1.0000 | Freq: Two times a day (BID) | NASAL | 0 refills | Status: AC

## 2022-11-19 MED ORDER — CETIRIZINE HCL 10 MG PO TABS: 10.0000 mg | ORAL_TABLET | Freq: Every day | ORAL | 0 refills | Status: AC

## 2022-11-19 MED ORDER — DICYCLOMINE HCL 10 MG PO CAPS: 10.0000 mg | ORAL_CAPSULE | Freq: Three times a day (TID) | ORAL | 0 refills | Status: AC

## 2022-11-19 NOTE — ED Provider Notes (Signed)
[facility] Provider Note  Patient Contact: 5:49 PM (approximate)   History   Chest Pain   HPI  James Mclaughlin is a 22 y.o. male who presents emergency department complaining of chest pain, diffuse abdominal pain, diarrhea, headache.  Symptoms for the last 3 to 4 days.  No reported fevers.  Patient denies any emesis, diarrhea or constipation.  No urinary changes.  Does have a history of asthma but states that he has not had an exacerbation since he was a kid.  No cardiac history.  Denies any medications for the symptoms.  Patient states that he has seen a little bit of blood in his diarrhea.  No history of GI bleed.     Physical Exam   Triage Vital Signs: ED Triage Vitals  Enc Vitals Group     BP 11/19/22 1620 131/67     Pulse Rate 11/19/22 1620 73     Resp 11/19/22 1620 18     Temp 11/19/22 1620 98.5 F (36.9 C)     Temp Source 11/19/22 1620 Oral     SpO2 11/19/22 1620 99 %     Weight 11/19/22 1621 169 lb (76.7 kg)     Height 11/19/22 1621 5\' 8"  (1.727 m)     Head Circumference --      Peak Flow --      Pain Score 11/19/22 1633 9     Pain Loc --      Pain Edu? --      Excl. in [place]? --     Most recent vital signs: Vitals:   11/19/22 1620 11/19/22 1944  BP: 131/67 (!) 116/54  Pulse: 73 76  Resp: 18 17  Temp: 98.5 F (36.9 C) 97.6 F (36.4 C)  SpO2: 99% 100%     General: Alert and in no acute distress. ENT:      Ears:       Nose: Positive congestion/rhinnorhea.      Mouth/Throat: Mucous membranes are moist. Neck: No stridor. No cervical spine tenderness to palpation. Hematological/Lymphatic/Immunilogical: No cervical lymphadenopathy. Cardiovascular:  Good peripheral perfusion Respiratory: Normal respiratory effort without tachypnea or retractions. Lungs CTAB. Good air entry to the bases with no decreased or absent breath sounds. Gastrointestinal: Bowel sounds 4 quadrants.  Soft to palpation all quadrants.  Diffuse tenderness without  point specific tenderness.  No palpable abnormality.. No guarding or rigidity. No palpable masses. No distention. No CVA tenderness. Musculoskeletal: Full range of motion to all extremities.  Neurologic:  No gross focal neurologic deficits are appreciated.  Skin:   No rash noted Other:   ED Results / Procedures / Treatments   Labs (all labs ordered are listed, but only abnormal results are displayed) Labs Reviewed  BASIC METABOLIC PANEL - Abnormal; Notable for the following components:      Result Value   Glucose, Bld 105 (*)    All other components within normal limits  CBC - Abnormal; Notable for the following components:   Hemoglobin 17.3 (*)    All other components within normal limits  HEPATIC FUNCTION PANEL - Abnormal; Notable for the following components:   Total Protein 6.4 (*)    All other components within normal limits  URINALYSIS, W/ REFLEX TO CULTURE (INFECTION SUSPECTED) - Abnormal; Notable for the following components:   Color, Urine YELLOW (*)    APPearance HAZY (*)    Bacteria, UA RARE (*)    All other components within normal limits  SARS CORONAVIRUS 2 BY  RT PCR  LIPASE, BLOOD  TROPONIN I (HIGH SENSITIVITY)  TROPONIN I (HIGH SENSITIVITY)     EKG  ED ECG REPORT I, Delorise Royals Thomes Burak,  personally viewed and interpreted this ECG.   Date: 11/19/2022  EKG Time: 1616 hrs.  Rate: 74 bpm  Rhythm: unchanged from previous tracings, normal sinus rhythm  Axis: Normal axis  Intervals:none  ST&T Change: No gross ST elevation or depression noted.  Nonspecific T wave changes in the inferior leads  Normal sinus rhythm.  No STEMI.  No significant change from previous EKG.     RADIOLOGY  I personally viewed, evaluated, and interpreted these images as part of my medical decision making, as well as reviewing the written report by the radiologist.  ED Provider Interpretation: No acute findings on chest x-ray.  CT scan revealed scattered stool but no evidence of  colonic infection, obstruction, cholecystitis or appendicitis.  CT ABDOMEN PELVIS W CONTRAST  Result Date: 11/19/2022 CLINICAL DATA:  Shortness of breath, chest pain, headache and diarrhea with the abdominal pain. EXAM: CT ABDOMEN AND PELVIS WITH CONTRAST TECHNIQUE: Multidetector CT imaging of the abdomen and pelvis was performed using the standard protocol following bolus administration of intravenous contrast. RADIATION DOSE REDUCTION: This exam was performed according to the departmental dose-optimization program which includes automated exposure control, adjustment of the mA and/or kV according to patient size and/or use of iterative reconstruction technique. CONTRAST:  OMNIPAQUE IOHEXOL 300 MG/ML  SOLN COMPARISON:  CT 06/10/2022 FINDINGS: Lower chest: No acute abnormality. Hepatobiliary: No focal liver abnormality is seen. No gallstones, gallbladder wall thickening, or biliary dilatation. Pancreas: Unremarkable. No pancreatic ductal dilatation or surrounding inflammatory changes. Spleen: Normal in size without focal abnormality. Adrenals/Urinary Tract: Asymmetric appearance of the kidneys with the right kidney smaller than left. However preserved enhancement and no collecting system dilatation. Adrenal glands are preserved. The ureters have normal course and caliber extending down to the bladder. Bladder is mildly distended with fluid. Preserved contour Stomach/Bowel: On this non oral contrast exam large bowel is of normal course and caliber with scattered stool. Normal appendix extends inferior to the cecum in the right lower quadrant. The stomach is mildly distended with fluid. Small bowel is nondilated. Vascular/Lymphatic: No significant vascular findings are present. No enlarged abdominal or pelvic lymph nodes. Reproductive: Prostate is unremarkable. Other: No free air or free fluid. Musculoskeletal: No acute or significant osseous findings. IMPRESSION: No bowel obstruction, free air or free  fluid. Scattered stool. Normal appendix. 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 This is chronic. No collecting system dilatation. Preserved enhancement. Electronically Signed   By: Karen Kays M.D.   On: 11/19/2022 18:32   DG Chest 2 View  Result Date: 11/19/2022 CLINICAL DATA:  Chest pain EXAM: CHEST - 2 VIEW COMPARISON:  Radiographs 09/28/2022 FINDINGS: The heart size and mediastinal contours are within normal limits. Both lungs are clear. The visualized skeletal structures are unremarkable. IMPRESSION: No active cardiopulmonary disease. Electronically Signed   By: Minerva Fester M.D.   On: 11/19/2022 17:05    PROCEDURES:  Critical Care performed: No  Procedures   MEDICATIONS ORDERED IN ED: Medications  sodium chloride 0.9 % bolus 1,000 mL (1,000 mLs Intravenous New Bag/Given 11/19/22 1756)  iohexol (OMNIPAQUE) 300 MG/ML solution 100 mL (100 mLs Intravenous Contrast Given 11/19/22 1817)     IMPRESSION / MDM / ASSESSMENT AND PLAN / ED COURSE  I reviewed the triage vital signs and the nursing notes.  Differential diagnosis includes, but is not limited to, viral illness, COVID, flu, gastroenteritis, colitis, diverticulitis, asthma  Patient's presentation is most consistent with acute presentation with potential threat to life or bodily function.   Patient's diagnosis is consistent with viral illness.  Patient presents emergency department with headache, congestion, cough, abdominal cramping, diarrhea.  Suspect viral illness but with symptoms, labs and imaging was obtained.  No acute findings on x-ray of the chest or CT abdomen pelvis.  Labs are overall reassuring.  Negative COVID swab.  At this time with the symptoms I do suspect viral illness and will prescribe some controlled medication.  Follow-up with primary care as needed.  Return precautions discussed with the patient..  Patient is given ED  precautions to return to the ED for any worsening or new symptoms.     FINAL CLINICAL IMPRESSION(S) / ED DIAGNOSES   Final diagnoses:  Viral illness     Rx / DC Orders   ED Discharge Orders          Ordered    ondansetron (ZOFRAN-ODT) 4 MG disintegrating tablet  Every 8 hours PRN        11/19/22 1958    loperamide (IMODIUM A-D) 2 MG tablet  4 times daily PRN        11/19/22 1958    dicyclomine (BENTYL) 10 MG capsule  3 times daily before meals & bedtime        11/19/22 1958    fluticasone (FLONASE) 50 MCG/ACT nasal spray  2 times daily        11/19/22 1958    cetirizine (ZYRTEC) 10 MG tablet  Daily        11/19/22 1958             Note:  This document was prepared using Dragon voice recognition software and may include unintentional dictation errors.   Lanette Hampshire 11/19/22 Turner Daniels, MD 11/20/22 1116

## 2022-11-19 NOTE — ED Notes (Signed)
Called lab to add on lipase and hepatic function 

## 2022-11-19 NOTE — ED Triage Notes (Addendum)
Pt presents to ED via POV from home c/o SOB, medial CP, HA, diarrhea, and abdominal pain that started last night. Hx asthma. Pt ambulatory to triage without difficulty. Pt in NAD at this time.

## 2023-03-22 ENCOUNTER — Other Ambulatory Visit: Payer: Self-pay

## 2023-03-22 ENCOUNTER — Emergency Department
Admission: EM | Admit: 2023-03-22 | Discharge: 2023-03-22 | Disposition: A | Discharge status: Home / Self Care | Payer: Medicaid Other | Source: Home / Self Care | Attending: Emergency Medicine | Admitting: Emergency Medicine

## 2023-03-22 DIAGNOSIS — J111 Influenza due to unidentified influenza virus with other respiratory manifestations: Secondary | ICD-10-CM

## 2023-03-22 DIAGNOSIS — J45909 Unspecified asthma, uncomplicated: Secondary | ICD-10-CM | POA: Insufficient documentation

## 2023-03-22 DIAGNOSIS — R1012 Left upper quadrant pain: Secondary | ICD-10-CM | POA: Diagnosis not present

## 2023-03-22 DIAGNOSIS — R1110 Vomiting, unspecified: Secondary | ICD-10-CM | POA: Insufficient documentation

## 2023-03-22 DIAGNOSIS — Z20822 Contact with and (suspected) exposure to covid-19: Secondary | ICD-10-CM | POA: Diagnosis not present

## 2023-03-22 DIAGNOSIS — R5383 Other fatigue: Secondary | ICD-10-CM | POA: Diagnosis present

## 2023-03-22 DIAGNOSIS — R6883 Chills (without fever): Secondary | ICD-10-CM | POA: Insufficient documentation

## 2023-03-22 LAB — CBC WITH DIFFERENTIAL/PLATELET
Abs Immature Granulocytes: 0.04 10*3/uL (ref 0.00–0.07)
Basophils Absolute: 0 10*3/uL (ref 0.0–0.1)
Basophils Relative: 1 %
Eosinophils Absolute: 0 10*3/uL (ref 0.0–0.5)
Eosinophils Relative: 1 %
HCT: 49.8 % (ref 39.0–52.0)
Hemoglobin: 17.1 g/dL — ABNORMAL HIGH (ref 13.0–17.0)
Immature Granulocytes: 1 %
Lymphocytes Relative: 20 %
Lymphs Abs: 1.8 10*3/uL (ref 0.7–4.0)
MCH: 29.9 pg (ref 26.0–34.0)
MCHC: 34.3 g/dL (ref 30.0–36.0)
MCV: 87.1 fL (ref 80.0–100.0)
Monocytes Absolute: 0.4 10*3/uL (ref 0.1–1.0)
Monocytes Relative: 4 %
Neutro Abs: 6.5 10*3/uL (ref 1.7–7.7)
Neutrophils Relative %: 73 %
Platelets: 251 10*3/uL (ref 150–400)
RBC: 5.72 MIL/uL (ref 4.22–5.81)
RDW: 12.6 % (ref 11.5–15.5)
WBC: 8.8 10*3/uL (ref 4.0–10.5)
nRBC: 0 % (ref 0.0–0.2)

## 2023-03-22 LAB — COMPREHENSIVE METABOLIC PANEL
ALT: 19 U/L (ref 0–44)
AST: 20 U/L (ref 15–41)
Albumin: 4.3 g/dL (ref 3.5–5.0)
Alkaline Phosphatase: 48 U/L (ref 38–126)
Anion gap: 5 (ref 5–15)
BUN: 13 mg/dL (ref 6–20)
CO2: 28 mmol/L (ref 22–32)
Calcium: 9.1 mg/dL (ref 8.9–10.3)
Chloride: 104 mmol/L (ref 98–111)
Creatinine, Ser: 1.12 mg/dL (ref 0.61–1.24)
GFR, Estimated: >60 mL/min (ref >60)
Glucose, Bld: 128 mg/dL — ABNORMAL HIGH (ref 70–99)
Potassium: 4.2 mmol/L (ref 3.5–5.1)
Sodium: 137 mmol/L (ref 135–145)
Total Bilirubin: 0.7 mg/dL (ref 0.3–1.2)
Total Protein: 6.9 g/dL (ref 6.5–8.1)

## 2023-03-22 LAB — RESP PANEL BY RT-PCR (RSV, FLU A&B, COVID)  RVPGX2
Influenza A by PCR: NEGATIVE
Influenza B by PCR: NEGATIVE
Resp Syncytial Virus by PCR: NEGATIVE
SARS Coronavirus 2 by RT PCR: NEGATIVE

## 2023-03-22 LAB — TROPONIN I (HIGH SENSITIVITY): Troponin I (High Sensitivity): 2 ng/L (ref <18)

## 2023-03-22 LAB — LIPASE, BLOOD: Lipase: 40 U/L (ref 11–51)

## 2023-03-22 MED ORDER — ONDANSETRON 4 MG PO TBDP
4.0000 mg | ORAL_TABLET | Freq: Once | ORAL | Status: AC
  Administered 2023-03-22: 4 mg via ORAL
  Filled 2023-03-22: qty 1

## 2023-03-22 MED ORDER — NAPROXEN 500 MG PO TABS: 500.0000 mg | ORAL_TABLET | Freq: Two times a day (BID) | ORAL | 0 refills | Status: AC

## 2023-03-22 MED ORDER — KETOROLAC TROMETHAMINE 15 MG/ML IJ SOLN
15.0000 mg | Freq: Once | INTRAMUSCULAR | Status: AC
  Administered 2023-03-22: 15 mg via INTRAMUSCULAR
  Filled 2023-03-22: qty 1

## 2023-03-22 MED ORDER — ONDANSETRON 4 MG PO TBDP: 4.0000 mg | ORAL_TABLET | Freq: Three times a day (TID) | ORAL | 0 refills | Status: DC | PRN

## 2023-03-22 MED ORDER — ALUM & MAG HYDROXIDE-SIMETH 200-200-20 MG/5ML PO SUSP
30.0000 mL | Freq: Once | ORAL | Status: AC
  Administered 2023-03-22: 30 mL via ORAL
  Filled 2023-03-22: qty 30

## 2023-03-22 MED ORDER — FAMOTIDINE 20 MG PO TABS
40.0000 mg | ORAL_TABLET | Freq: Once | ORAL | Status: AC
  Administered 2023-03-22: 40 mg via ORAL
  Filled 2023-03-22: qty 2

## 2023-03-22 MED ORDER — FAMOTIDINE 20 MG PO TABS: 20.0000 mg | ORAL_TABLET | Freq: Two times a day (BID) | ORAL | 0 refills | Status: AC

## 2023-03-22 NOTE — ED Provider Notes (Signed)
[facility] Provider Note    Event Date/Time   First MD Initiated Contact with Patient 03/22/23 2153     (approximate)   History   Chief Complaint: flu symptoms    HPI  Zyad Glessner is a 22 y.o. male with a past history of asthma who comes ED complaining of generalized bodyaches, chills, fatigue, vomiting that started at about 7:30 PM tonight.  Before that he was in his usual state of health.  Denies specific chest pain or shortness of breath.  No diarrhea.     Physical Exam   Triage Vital Signs: ED Triage Vitals  Encounter Vitals Group     BP 03/22/23 2134 (!) 141/94     Systolic BP Percentile --      Diastolic BP Percentile --      Pulse Rate 03/22/23 2134 78     Resp 03/22/23 2134 18     Temp 03/22/23 2134 98.5 F (36.9 C)     Temp Source 03/22/23 2134 Oral     SpO2 03/22/23 2134 95 %     Weight 03/22/23 2133 180 lb (81.6 kg)     Height 03/22/23 2133 5\' 8"  (1.727 m)     Head Circumference --      Peak Flow --      Pain Score 03/22/23 2133 7     Pain Loc --      Pain Education --      Exclude from Growth Chart --     Most recent vital signs: Vitals:   03/22/23 2134  BP: (!) 141/94  Pulse: 78  Resp: 18  Temp: 98.5 F (36.9 C)  SpO2: 95%    General: Awake, no distress.  CV:  Good peripheral perfusion.  Regular rate and rhythm Resp:  Normal effort.  Clear to auscultation bilaterally no wheezing Abd:  No distention.  Soft with mild generalized tenderness, worse in the left upper quadrant Other:  Moist oral mucosa   ED Results / Procedures / Treatments   Labs (all labs ordered are listed, but only abnormal results are displayed) Labs Reviewed  CBC WITH DIFFERENTIAL/PLATELET - Abnormal; Notable for the following components:      Result Value   Hemoglobin 17.1 (*)    All other components within normal limits  COMPREHENSIVE METABOLIC PANEL - Abnormal; Notable for the following components:   Glucose, Bld 128 (*)    All other  components within normal limits  RESP PANEL BY RT-PCR (RSV, FLU A&B, COVID)  RVPGX2  LIPASE, BLOOD  TROPONIN I (HIGH SENSITIVITY)     EKG Interpreted by me Normal sinus rhythm rate of 68.  No axis, QRS ST segments and T waves.   RADIOLOGY    PROCEDURES:  Procedures   MEDICATIONS ORDERED IN ED: Medications  alum & mag hydroxide-simeth (MAALOX/MYLANTA) 200-200-20 MG/5ML suspension 30 mL (30 mLs Oral Given 03/22/23 2219)  famotidine (PEPCID) tablet 40 mg (40 mg Oral Given 03/22/23 2218)  ketorolac (TORADOL) 15 MG/ML injection 15 mg (15 mg Intramuscular Given 03/22/23 2218)  ondansetron (ZOFRAN-ODT) disintegrating tablet 4 mg (4 mg Oral Given 03/22/23 2218)     IMPRESSION / MDM / ASSESSMENT AND PLAN / ED COURSE  I reviewed the triage vital signs and the nursing notes.  DDx: AKI, electrolyte abnormality, pancreatitis, COVID, influenza, other viral illness, gastritis/foodborne illness  Patient's presentation is most consistent with acute presentation with potential threat to life or bodily function.  Patient presents with vomiting and constitutional symptoms consistent  with an influenza-like illness.  Will give antacids, antiemetics for symptom relief, check labs.   ----------------------------------------- 11:14 PM on 03/22/2023 ----------------------------------------- Labs normal.  Symptoms improved.  Patient will check MyChart for COVID/flu result.  Work note and excuse note for graphic court date tomorrow provided. Considering the patient's symptoms, medical history, and physical examination today, I have low suspicion for cholecystitis or biliary pathology, pancreatitis, perforation or bowel obstruction, hernia, intra-abdominal abscess, AAA or dissection, volvulus or intussusception, mesenteric ischemia, or appendicitis.       FINAL CLINICAL IMPRESSION(S) / ED DIAGNOSES   Final diagnoses:  Influenza-like illness     Rx / DC Orders   ED Discharge Orders           Ordered    ondansetron (ZOFRAN-ODT) 4 MG disintegrating tablet  Every 8 hours PRN        03/22/23 2312    naproxen (NAPROSYN) 500 MG tablet  2 times daily with meals        03/22/23 2312    famotidine (PEPCID) 20 MG tablet  2 times daily        03/22/23 2312             Note:  This document was prepared using Dragon voice recognition software and may include unintentional dictation errors.   Sharman Cheek, MD 03/22/23 [id number]

## 2023-03-22 NOTE — ED Notes (Signed)
Pt denied being able to produce a urine sample at this time. Pt provided with a labeled specimen cup and instructions to return cup to triage nurse desk once it has a clean catch urine sample.

## 2023-03-22 NOTE — ED Triage Notes (Signed)
Pt reports h/a, chest pain, abd pain, and generalized body aches. Reports acute onset with n/v at 1930 and was sent home from work. Denies fevers. Pt ambulatory to triage. Alert and oriented following commands. Breathing unlabored speaking in full sentences. Symmetric chest rise and fall.

## 2023-04-05 ENCOUNTER — Other Ambulatory Visit: Payer: Self-pay

## 2023-04-05 ENCOUNTER — Emergency Department
Admission: EM | Admit: 2023-04-05 | Discharge: 2023-04-05 | Disposition: A | Discharge status: Home / Self Care | Payer: Medicaid Other | Attending: Emergency Medicine | Admitting: Emergency Medicine

## 2023-04-05 DIAGNOSIS — R110 Nausea: Secondary | ICD-10-CM

## 2023-04-05 DIAGNOSIS — U071 COVID-19: Secondary | ICD-10-CM | POA: Diagnosis not present

## 2023-04-05 DIAGNOSIS — M791 Myalgia, unspecified site: Secondary | ICD-10-CM | POA: Diagnosis present

## 2023-04-05 LAB — SARS CORONAVIRUS 2 BY RT PCR: SARS Coronavirus 2 by RT PCR: POSITIVE — AB

## 2023-04-05 MED ORDER — ONDANSETRON 4 MG PO TBDP
4.0000 mg | ORAL_TABLET | Freq: Once | ORAL | Status: AC
  Administered 2023-04-05: 4 mg via ORAL
  Filled 2023-04-05: qty 1

## 2023-04-05 MED ORDER — ONDANSETRON 4 MG PO TBDP: 4.0000 mg | ORAL_TABLET | Freq: Three times a day (TID) | ORAL | 0 refills | Status: AC | PRN

## 2023-04-05 NOTE — Discharge Instructions (Signed)
You can take Zofran every 8 hours as needed for nausea and vomiting. Your COVID-19 test will result to MyChart. Rest and stay hydrated at home. You can take 600 mg of ibuprofen alternating with 1000 mg of Tylenol every 6 hours.

## 2023-04-05 NOTE — ED Provider Notes (Signed)
[facility] Provider Note  Patient Contact: 1:11 PM (approximate)   History   Generalized Body Aches   HPI  James Mclaughlin is a 22 y.o. male presents to the emergency department with chills and bodyaches after being exposed to someone with COVID-19.  Patient reports that he is also had nausea and vomiting and has had difficulty keeping fluids down at home.  No chest pain, chest tightness or abdominal pain.      Physical Exam   Triage Vital Signs: ED Triage Vitals  Encounter Vitals Group     BP 04/05/23 1249 128/81     Systolic BP Percentile --      Diastolic BP Percentile --      Pulse Rate 04/05/23 1249 73     Resp 04/05/23 1249 16     Temp 04/05/23 1248 97.7 F (36.5 C)     Temp Source 04/05/23 1248 Oral     SpO2 04/05/23 1250 97 %     Weight 04/05/23 1249 179 lb 14.3 oz (81.6 kg)     Height 04/05/23 1249 5\' 8"  (1.727 m)     Head Circumference --      Peak Flow --      Pain Score 04/05/23 1249 10     Pain Loc --      Pain Education --      Exclude from Growth Chart --     Most recent vital signs: Vitals:   04/05/23 1249 04/05/23 1250  BP: 128/81   Pulse: 73   Resp: 16   Temp:    SpO2:  97%    Constitutional: Alert and oriented. Patient is lying supine. Eyes: Conjunctivae are normal. PERRL. EOMI. Head: Atraumatic. ENT:      Ears: Tympanic membranes are mildly injected with mild effusion bilaterally.       Nose: No congestion/rhinnorhea.      Mouth/Throat: Mucous membranes are moist. Posterior pharynx is mildly erythematous.  Hematological/Lymphatic/Immunilogical: No cervical lymphadenopathy.  Cardiovascular: Normal rate, regular rhythm. Normal S1 and S2.  Good peripheral circulation. Respiratory: Normal respiratory effort without tachypnea or retractions. Lungs CTAB. Good air entry to the bases with no decreased or absent breath sounds. Gastrointestinal: Bowel sounds 4 quadrants. Soft and nontender to palpation. No guarding or  rigidity. No palpable masses. No distention. No CVA tenderness. Musculoskeletal: Full range of motion to all extremities. No gross deformities appreciated. Neurologic:  Normal speech and language. No gross focal neurologic deficits are appreciated.  Skin:  Skin is warm, dry and intact. No rash noted. Psychiatric: Mood and affect are normal. Speech and behavior are normal. Patient exhibits appropriate insight and judgement.    ED Results / Procedures / Treatments   Labs (all labs ordered are listed, but only abnormal results are displayed) Labs Reviewed  SARS CORONAVIRUS 2 BY RT PCR       PROCEDURES:  Critical Care performed: No  Procedures   MEDICATIONS ORDERED IN ED: Medications  ondansetron (ZOFRAN-ODT) disintegrating tablet 4 mg (has no administration in time range)     IMPRESSION / MDM / ASSESSMENT AND PLAN / ED COURSE  I reviewed the triage vital signs and the nursing notes.                              Assessment and plan Nausea 22 year old male presents to the emergency department with chills and nausea after being exposed to someone with COVID-19.  Vital signs  are reassuring at triage.  On exam, patient alert, active and nontoxic-appearing.  COVID-19 test in process.  Patient was given a dose of Zofran while he was in the emergency department and discharged with a short course of Zofran.  Recommended checking COVID test on MyChart.  Patient feels comfortable with this plan.  All patient questions were answered.     FINAL CLINICAL IMPRESSION(S) / ED DIAGNOSES   Final diagnoses:  Nausea     Rx / DC Orders   ED Discharge Orders          Ordered    ondansetron (ZOFRAN-ODT) 4 MG disintegrating tablet  Every 8 hours PRN        04/05/23 1300             Note:  This document was prepared using Dragon voice recognition software and may include unintentional dictation errors.   Pia Mau [place], PA-C 04/05/23 1313    Jene Every, MD 04/05/23  [phone]

## 2023-04-05 NOTE — ED Triage Notes (Signed)
Pt here with covid like symptoms, pt states he was exposed to someone that has covid. Pt having body aches, chills, and can not keep food down. Pt states he took a at home test but it was negative.

## 2023-04-27 ENCOUNTER — Ambulatory Visit: Payer: Medicaid Other

## 2023-08-03 IMAGING — CR DG CHEST 2V
1 series · 2 of 2 positions shown · non-contrast
Comparison: None.

CLINICAL DATA: Coughing and congestion.

EXAM:
CHEST - 2 VIEW

[Series 1: dg chest 2 view · 0.14mm/px · 2 of 2 slices shown]
[im 1/2]
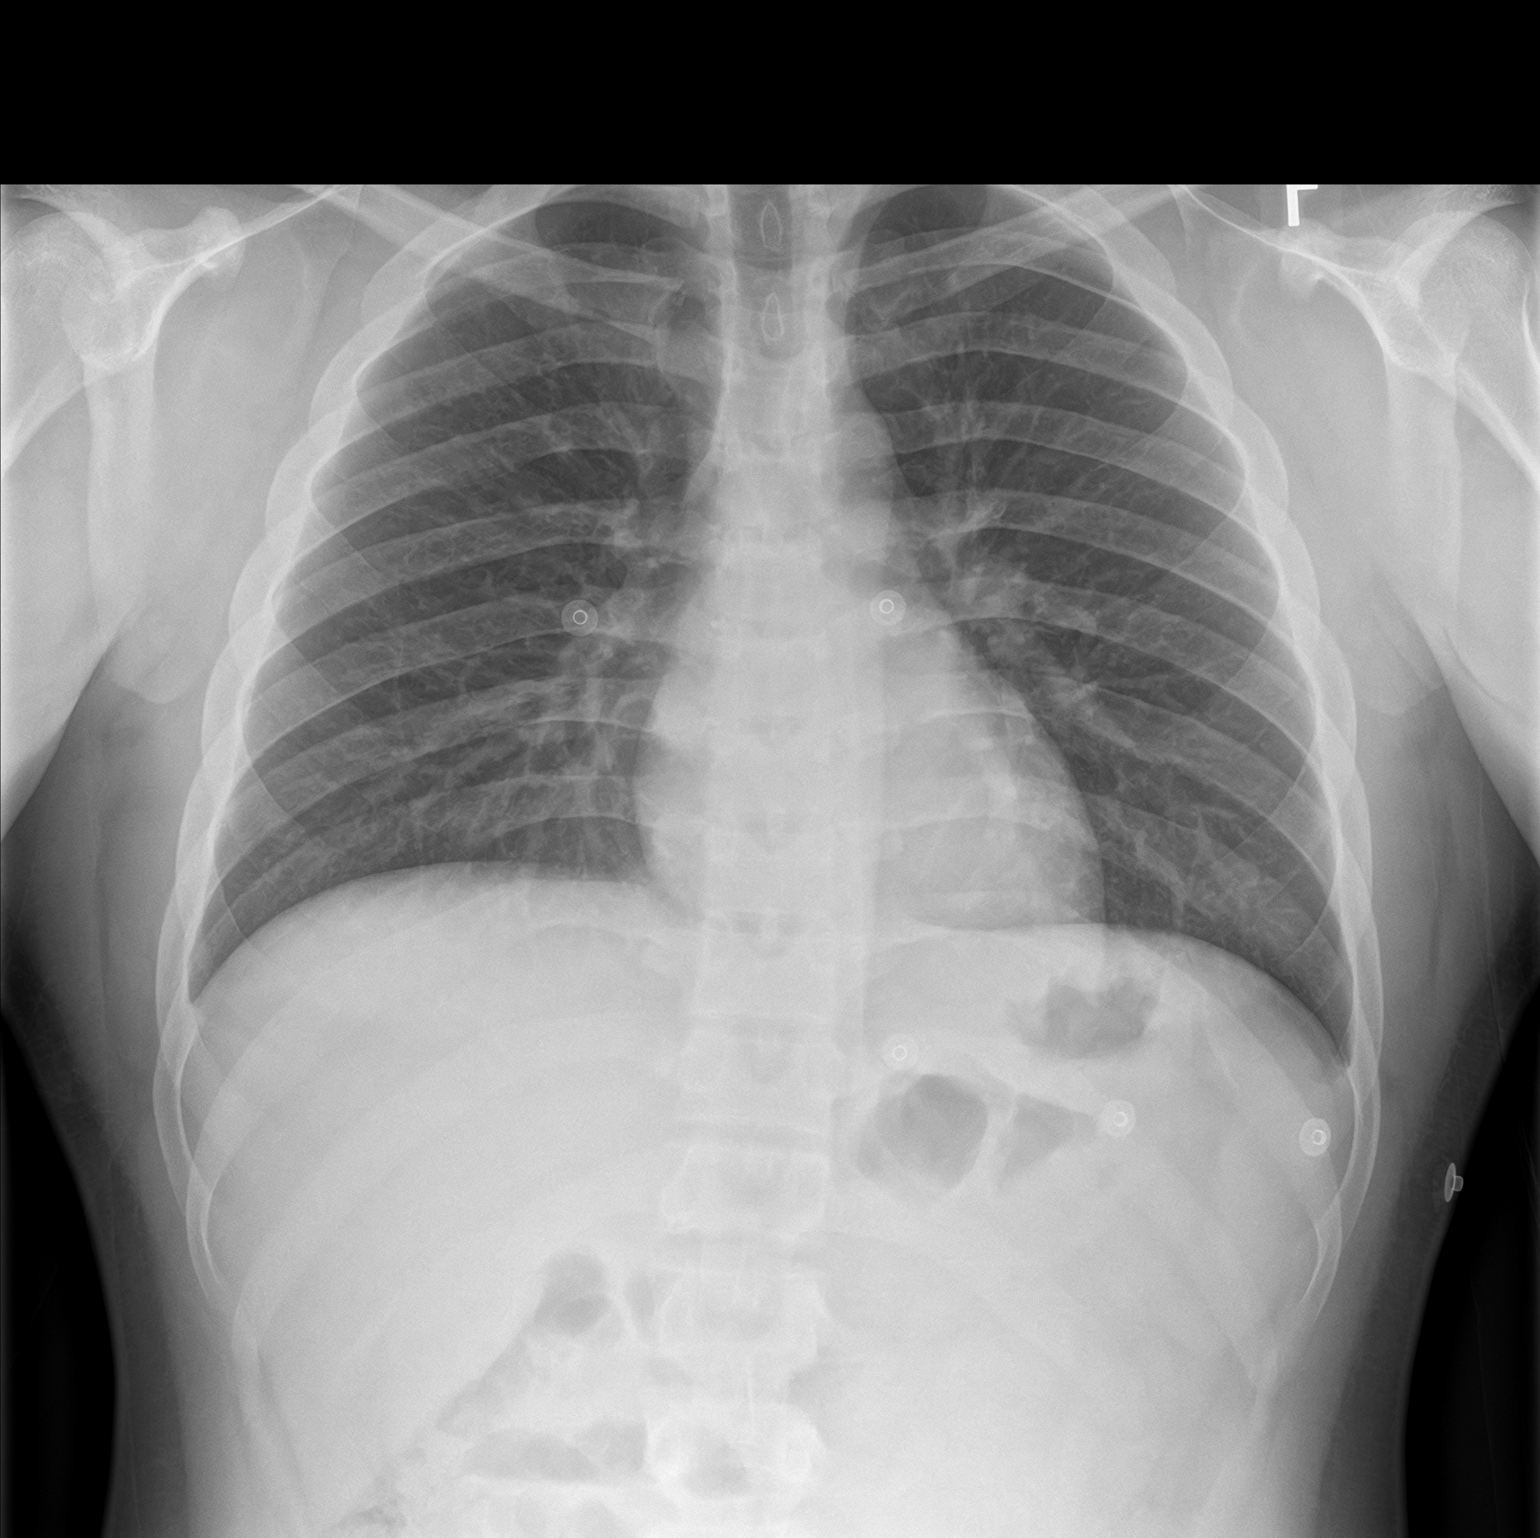
[im 2/2]
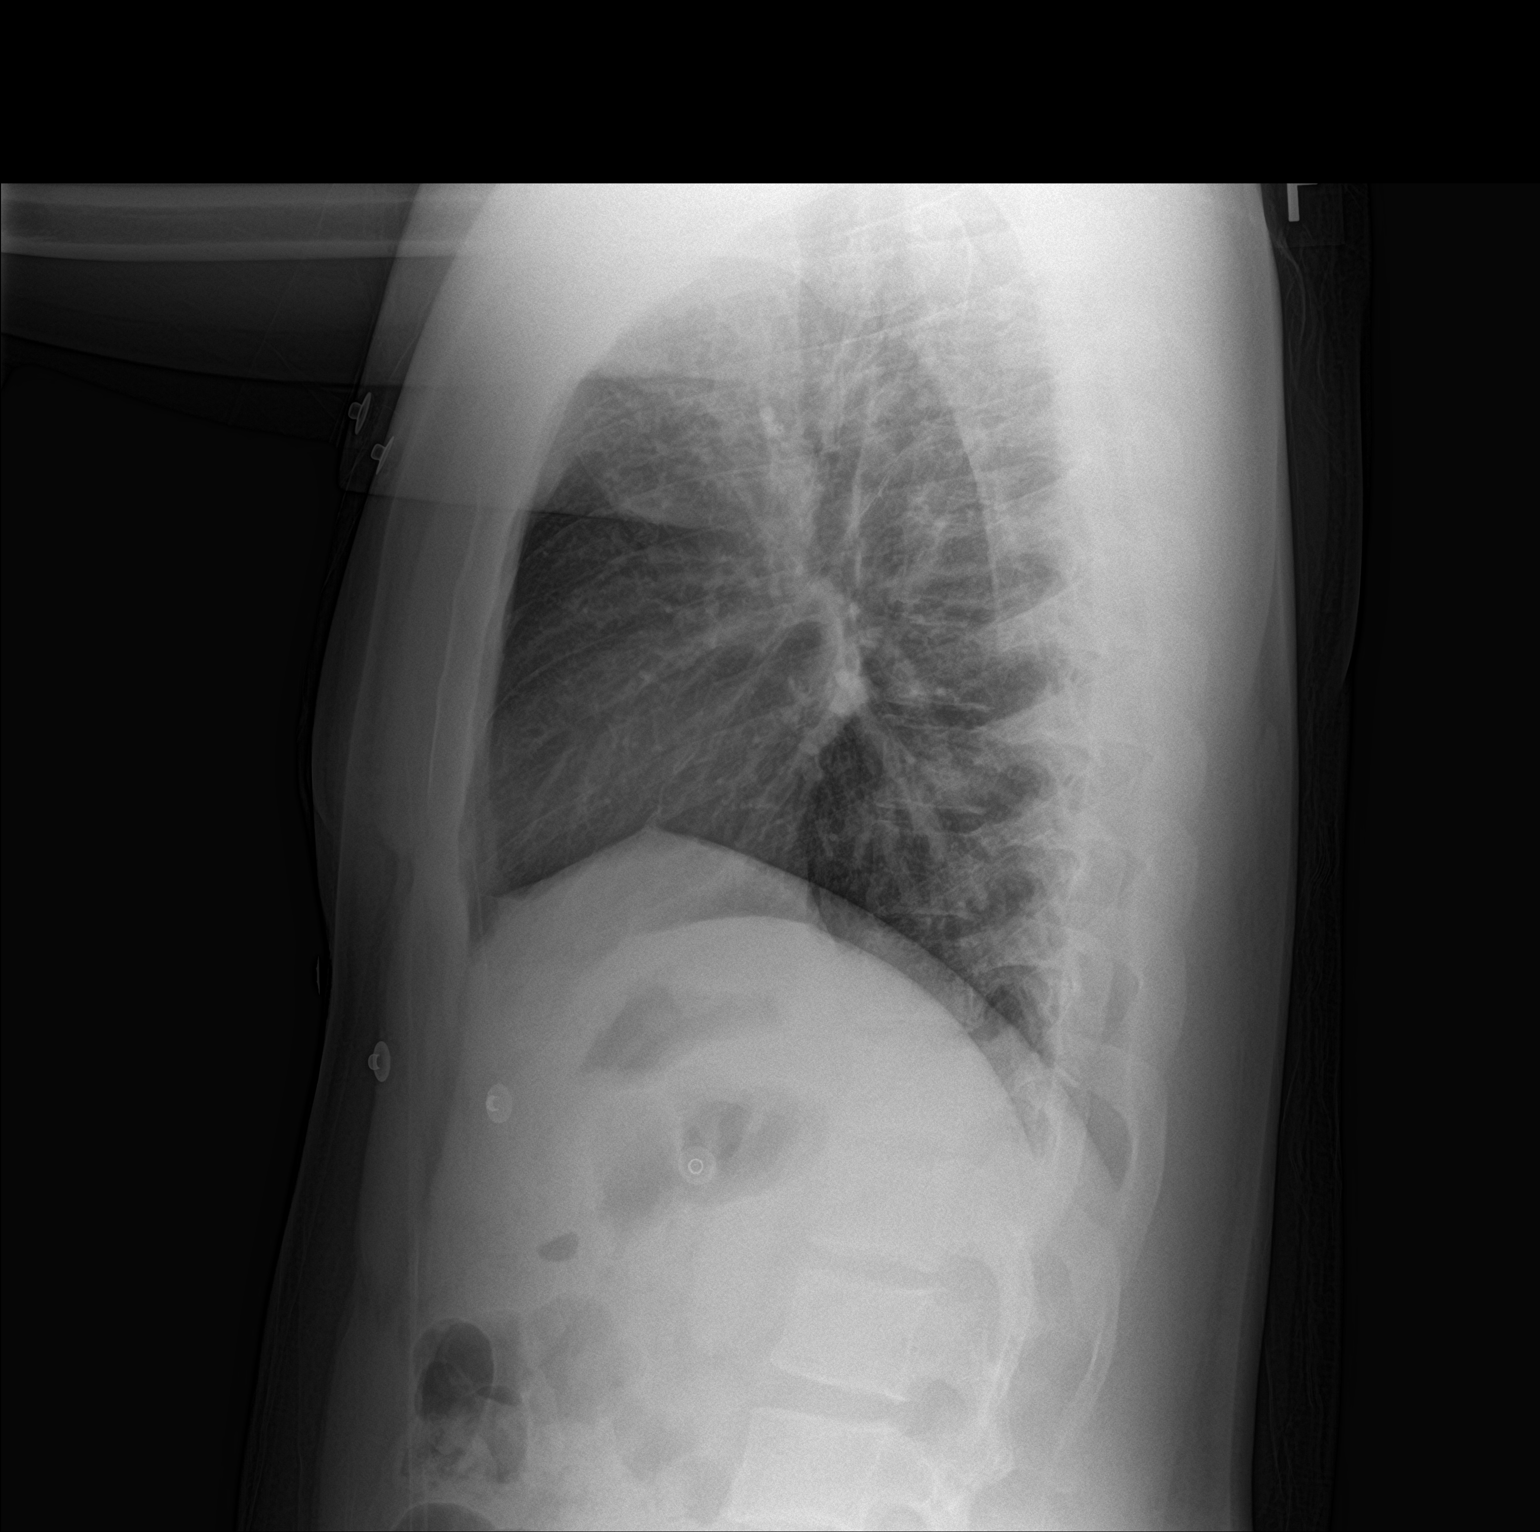

[2 of 2 positions shown; findings below may reference images not displayed]

FINDINGS: The heart size and mediastinal contours are within normal limits.
Both lungs are clear. The visualized skeletal structures are
unremarkable.
IMPRESSION: No active cardiopulmonary disease.

## 2023-09-14 ENCOUNTER — Emergency Department: Payer: Medicaid Other

## 2023-09-14 ENCOUNTER — Other Ambulatory Visit: Payer: Self-pay

## 2023-09-14 DIAGNOSIS — R197 Diarrhea, unspecified: Secondary | ICD-10-CM | POA: Insufficient documentation

## 2023-09-14 DIAGNOSIS — R109 Unspecified abdominal pain: Secondary | ICD-10-CM | POA: Insufficient documentation

## 2023-09-14 DIAGNOSIS — Z20822 Contact with and (suspected) exposure to covid-19: Secondary | ICD-10-CM | POA: Insufficient documentation

## 2023-09-14 DIAGNOSIS — R079 Chest pain, unspecified: Secondary | ICD-10-CM | POA: Insufficient documentation

## 2023-09-14 DIAGNOSIS — K625 Hemorrhage of anus and rectum: Secondary | ICD-10-CM | POA: Diagnosis not present

## 2023-09-14 DIAGNOSIS — R112 Nausea with vomiting, unspecified: Secondary | ICD-10-CM | POA: Insufficient documentation

## 2023-09-14 LAB — TYPE AND SCREEN
ABO/RH(D): A NEG
Antibody Screen: NEGATIVE

## 2023-09-14 LAB — CBC
HCT: 43.2 % (ref 39.0–52.0)
Hemoglobin: 14.6 g/dL (ref 13.0–17.0)
MCH: 29.4 pg (ref 26.0–34.0)
MCHC: 33.8 g/dL (ref 30.0–36.0)
MCV: 86.9 fL (ref 80.0–100.0)
Platelets: 240 10*3/uL (ref 150–400)
RBC: 4.97 MIL/uL (ref 4.22–5.81)
RDW: 12.8 % (ref 11.5–15.5)
WBC: 8 10*3/uL (ref 4.0–10.5)
nRBC: 0 % (ref 0.0–0.2)

## 2023-09-14 LAB — COMPREHENSIVE METABOLIC PANEL
ALT: 14 U/L (ref 0–44)
AST: 17 U/L (ref 15–41)
Albumin: 4 g/dL (ref 3.5–5.0)
Alkaline Phosphatase: 49 U/L (ref 38–126)
Anion gap: 8 (ref 5–15)
BUN: 9 mg/dL (ref 6–20)
CO2: 27 mmol/L (ref 22–32)
Calcium: 9.5 mg/dL (ref 8.9–10.3)
Chloride: 104 mmol/L (ref 98–111)
Creatinine, Ser: 1.05 mg/dL (ref 0.61–1.24)
GFR, Estimated: >60 mL/min (ref >60)
Glucose, Bld: 89 mg/dL (ref 70–99)
Potassium: 3.7 mmol/L (ref 3.5–5.1)
Sodium: 139 mmol/L (ref 135–145)
Total Bilirubin: 0.6 mg/dL (ref 0.0–1.2)
Total Protein: 6.8 g/dL (ref 6.5–8.1)

## 2023-09-14 LAB — LIPASE, BLOOD: Lipase: 36 U/L (ref 11–51)

## 2023-09-14 LAB — TROPONIN I (HIGH SENSITIVITY): Troponin I (High Sensitivity): 2 ng/L (ref <18)

## 2023-09-14 NOTE — ED Triage Notes (Signed)
Pt to ED via POV c/o abd pain, vomiting, blood in stool, and CP that started earlier today. Reports abd pain "all over". CP in center of chest, non radiating. Reports bright red blood in vomit and stool

## 2023-09-15 ENCOUNTER — Emergency Department: Payer: Medicaid Other

## 2023-09-15 ENCOUNTER — Emergency Department
Admission: EM | Admit: 2023-09-15 | Discharge: 2023-09-15 | Disposition: A | Discharge status: Home / Self Care | Payer: Medicaid Other | Attending: Emergency Medicine | Admitting: Emergency Medicine

## 2023-09-15 DIAGNOSIS — R112 Nausea with vomiting, unspecified: Secondary | ICD-10-CM

## 2023-09-15 LAB — TROPONIN I (HIGH SENSITIVITY): Troponin I (High Sensitivity): 4 ng/L (ref <18)

## 2023-09-15 LAB — RESP PANEL BY RT-PCR (RSV, FLU A&B, COVID)  RVPGX2
Influenza A by PCR: NEGATIVE
Influenza B by PCR: NEGATIVE
Resp Syncytial Virus by PCR: NEGATIVE
SARS Coronavirus 2 by RT PCR: NEGATIVE

## 2023-09-15 MED ORDER — FAMOTIDINE 20 MG PO TABS
20.0000 mg | ORAL_TABLET | Freq: Once | ORAL | Status: AC
  Administered 2023-09-15: 20 mg via ORAL
  Filled 2023-09-15: qty 1

## 2023-09-15 MED ORDER — PANTOPRAZOLE SODIUM 40 MG PO TBEC
40.0000 mg | DELAYED_RELEASE_TABLET | Freq: Once | ORAL | Status: AC
  Administered 2023-09-15: 40 mg via ORAL
  Filled 2023-09-15: qty 1

## 2023-09-15 MED ORDER — ONDANSETRON HCL 4 MG/2ML IJ SOLN
4.0000 mg | INTRAMUSCULAR | Status: AC
  Administered 2023-09-15: 4 mg via INTRAVENOUS
  Filled 2023-09-15: qty 2

## 2023-09-15 MED ORDER — IOHEXOL 300 MG/ML  SOLN
100.0000 mL | Freq: Once | INTRAMUSCULAR | Status: AC | PRN
Start: 2023-09-15 — End: 2023-09-15
  Administered 2023-09-15: 100 mL via INTRAVENOUS

## 2023-09-15 MED ORDER — PANTOPRAZOLE SODIUM 40 MG PO TBEC: 40.0000 mg | DELAYED_RELEASE_TABLET | Freq: Every day | ORAL | Status: DC

## 2023-09-15 NOTE — Discharge Instructions (Addendum)
I recommend you start an over-the-counter acid reducing medication such as Nexium or Prilosec.  Please follow the packaging instructions on use.  If you're unable to see her primary care doctor you may return to the emergency room or go to the [facility] walk-in clinic in 1 or 2 days for reexam.  Please return to the emergency room right away if you are to develop a fever, severe nausea, your pain becomes severe or worsens, you are unable to keep food down, begin vomiting any dark or bloody fluid, you develop any dark or bloody stools, feel dehydrated, or other new concerns or symptoms arise.

## 2023-09-15 NOTE — ED Provider Notes (Signed)
[facility] Provider Note    Event Date/Time   First MD Initiated Contact with Patient 09/15/23 0113     (approximate)   History   Abdominal Pain, Rectal Bleeding, and Chest Pain   HPI  Naphtali Zywicki is a 23 y.o. male   reports no major medical illness.  Today he this morning he started feeling like he was nauseated had vomiting loose stool and reports he also started feeling like pain in his mid abdomen.  He reports he vomited with streaks of blood in it.  He also had loose stool that he thought might of had some blood in it.  Started this morning.  No further abnormal stools or diarrhea.  No fevers or chills.  No shortness of breath.  Reports burning discomfort in his chest.  Patient reports that has had this happen to him once or twice in the past he was seen in the ER and diagnosed with 'GI something.'  He has not followed up with a primary or specialist.  He has never had an endoscopy.  He does not take any blood thinners does not take ibuprofen or pain relievers on a regular basis, he is does not use alcohol and has quit smoking no longer uses cannabis either.  Reports currently having a mild discomfort slight nausea.  Reports he threw up once in the ER since getting here in the bathroom.  Had a little bit of blood in      Physical Exam   Triage Vital Signs: ED Triage Vitals  Encounter Vitals Group     BP 09/14/23 2147 133/84     Systolic BP Percentile --      Diastolic BP Percentile --      Pulse Rate 09/14/23 2147 76     Resp 09/14/23 2147 18     Temp 09/14/23 2147 98.9 F (37.2 C)     Temp Source 09/14/23 2147 Oral     SpO2 09/14/23 2147 96 %     Weight 09/14/23 2148 180 lb (81.6 kg)     Height 09/14/23 2148 5\' 8"  (1.727 m)     Head Circumference --      Peak Flow --      Pain Score 09/14/23 2147 9     Pain Loc --      Pain Education --      Exclude from Growth Chart --     Most recent vital signs: Vitals:   09/14/23 2147  BP:  133/84  Pulse: 76  Resp: 18  Temp: 98.9 F (37.2 C)  SpO2: 96%     General: Awake, no distress.  Sitting comfortably in chair walks in bed without difficulty.  Posterior oropharynx normal oropharynx does not have any dried blood around it or in the posterior oropharynx.  Mucous membranes are normal. CV:  Good peripheral perfusion.  Normal tones and rate no murmur noted.  Patient reports a history of a heart murmur Resp:  Normal effort.  Clear bilateral with normal work of breathing Abd:  No distention.  Soft reports mild discomfort across the epigastrium left upper quadrant but no rebound or guarding.  No peritoneal signs.  No focal pain McBurney's point or in the right upper abdomen.  Overall reassuring exam without evidence of hernia Other:  Warm well-perfused extremities  Nontoxic well-appearing.  ED Results / Procedures / Treatments   Labs (all labs ordered are listed, but only abnormal results are displayed) Labs Reviewed  RESP PANEL BY  RT-PCR (RSV, FLU A&B, COVID)  RVPGX2  COMPREHENSIVE METABOLIC PANEL  CBC  LIPASE, BLOOD  POC OCCULT BLOOD, ED  TYPE AND SCREEN  TROPONIN I (HIGH SENSITIVITY)  TROPONIN I (HIGH SENSITIVITY)     EKG  And interpreted by me at 2150 heart rate 80 QRS 80 QTc 400 Normal sinus rhythm, probable left ventricular hypertrophy with repolarization abnormality.  ST abnormality is present in 3 aVF and lateral precordial leads again likely pointing towards a repolarization type abnormality.  Similar morphology is noted on previous EKG from August 2024 especially with regard to 3 and aVF.  Suspect LVH.   RADIOLOGY  Chest x-ray interpreted by me as negative for acute  CT ABDOMEN PELVIS W CONTRAST Result Date: 09/15/2023 CLINICAL DATA:  Abdominal pain and vomiting. EXAM: CT ABDOMEN AND PELVIS WITH CONTRAST TECHNIQUE: Multidetector CT imaging of the abdomen and pelvis was performed using the standard protocol following bolus administration of intravenous  contrast. RADIATION DOSE REDUCTION: This exam was performed according to the departmental dose-optimization program which includes automated exposure control, adjustment of the mA and/or kV according to patient size and/or use of iterative reconstruction technique. CONTRAST:  OMNIPAQUE IOHEXOL 300 MG/ML  SOLN COMPARISON:  November 19, 2022 FINDINGS: Lower chest: No acute abnormality. Hepatobiliary: No focal liver abnormality is seen. The gallbladder is contracted without evidence of gallstones, gallbladder wall thickening, or biliary dilatation. Pancreas: Unremarkable. No pancreatic ductal dilatation or surrounding inflammatory changes. Spleen: Normal in size without focal abnormality. Adrenals/Urinary Tract: Adrenal glands are unremarkable. Kidneys are normal, without renal calculi, focal lesion, or hydronephrosis. The urinary bladder is poorly distended and subsequently limited in evaluation. Stomach/Bowel: Stomach is within normal limits. Appendix appears normal. No evidence of bowel wall thickening, distention, or inflammatory changes. Vascular/Lymphatic: No significant vascular findings are present. No enlarged abdominal or pelvic lymph nodes. Reproductive: Prostate is unremarkable. Other: No abdominal wall hernia or abnormality. No abdominopelvic ascites. Musculoskeletal: No acute or significant osseous findings. IMPRESSION: No acute or active process within the abdomen or pelvis. Electronically Signed   By: Aram Candela M.D.   On: 09/15/2023 02:21   DG Chest 2 View Result Date: 09/14/2023 CLINICAL DATA:  Chest pain, abdominal pain, vomiting, blood in stool EXAM: CHEST - 2 VIEW COMPARISON:  11/19/2022 FINDINGS: The heart size and mediastinal contours are within normal limits. Both lungs are clear. The visualized skeletal structures are unremarkable. IMPRESSION: No active cardiopulmonary disease. Electronically Signed   By: Minerva Fester M.D.   On: 09/14/2023 22:18      PROCEDURES:  Critical  Care performed: No  Procedures   MEDICATIONS ORDERED IN ED: Medications  pantoprazole (PROTONIX) EC tablet 40 mg (has no administration in time range)  ondansetron (ZOFRAN) injection 4 mg (4 mg Intravenous Given 09/15/23 0135)  famotidine (PEPCID) tablet 20 mg (20 mg Oral Given 09/15/23 0136)  pantoprazole (PROTONIX) EC tablet 40 mg (40 mg Oral Given 09/15/23 0159)  iohexol (OMNIPAQUE) 300 MG/ML solution 100 mL (100 mLs Intravenous Contrast Given 09/15/23 0149)     IMPRESSION / MDM / ASSESSMENT AND PLAN / ED COURSE  I reviewed the triage vital signs and the nursing notes.                              Differential diagnosis includes but is not limited to, abdominal perforation, aortic dissection, cholecystitis, appendicitis, diverticulitis, colitis, esophagitis/gastritis, kidney stone, pyelonephritis, urinary tract infection, aortic aneurysm. All are considered in decision and  treatment plan. Based upon the patient's presentation and risk factors, I think it is prudent to obtain imaging of the abdomen given he associates nausea vomiting and also reports seeing some blood in his emesis.  He also reports seeing some blood in his stool but has not had large amounts of diarrhea.  His hemoglobin is normal his vital signs are normal he does not appear to have any evidence of acute major bleeding.  His clinical history pain in the epigastrium left upper quadrant somewhat suspicious also for gastritis, peptic ulcer disease, H. pylori etc.  Overall though are quite reassuring exam.  Will proceed with CT imaging provide Zofran treatment with PPI and Pepcid.  Discussed with the patient that if discharged would recommend he follow-up with a gastroenterologist.  He is agreeable to that plan I placed referral  Labs demonstrate normal CBC normal comprehensive metabolic panel.  His initial troponin is normal.  Repeat troponin normal   Patient's presentation is most consistent with acute complicated illness /  injury requiring diagnostic workup.   ----------------------------------------- 3:20 AM on 09/15/2023 ----------------------------------------- Imaging studies reassuring.  Patient resting comfortably utilizing cell phone without distress.  No further emesis.  Currently resting without discomfort.  Patient comfortable plan to discharge with careful return precautions, suspect possible gastritis, self-limited gastrointestinal illness.  Imaging labs and workup quite reassuring with no evidence of ongoing acute gastrointestinal symptoms or bleeding at this time  Patient amenable to following up with gastroenterology and provided that information.  We also discussed use of oral antacid such as Nexium or Prilosec which she is agreeable with.  He will not use any ibuprofen, NSAID, or aspirin type medications.  Return precautions and treatment recommendations and follow-up discussed with the patient who is agreeable with the plan.        FINAL CLINICAL IMPRESSION(S) / ED DIAGNOSES   Final diagnoses:  Nausea and vomiting, unspecified vomiting type     Rx / DC Orders   ED Discharge Orders          Ordered    Ambulatory referral to Gastroenterology       Comments: Er follow-up. Reports intermittent abd pain and vomiting with blood present   09/15/23 0134             Note:  This document was prepared using Dragon voice recognition software and may include unintentional dictation errors.   Sharyn Creamer, MD 09/15/23 [phone]

## 2023-10-30 ENCOUNTER — Encounter: Payer: Self-pay | Admitting: Emergency Medicine

## 2023-10-30 ENCOUNTER — Emergency Department
Admission: EM | Admit: 2023-10-30 | Discharge: 2023-10-30 | Disposition: A | Discharge status: Home / Self Care | Attending: Emergency Medicine | Admitting: Emergency Medicine

## 2023-10-30 ENCOUNTER — Other Ambulatory Visit: Payer: Self-pay

## 2023-10-30 DIAGNOSIS — H1012 Acute atopic conjunctivitis, left eye: Secondary | ICD-10-CM | POA: Insufficient documentation

## 2023-10-30 DIAGNOSIS — H5712 Ocular pain, left eye: Secondary | ICD-10-CM | POA: Diagnosis present

## 2023-10-30 DIAGNOSIS — J45909 Unspecified asthma, uncomplicated: Secondary | ICD-10-CM | POA: Diagnosis not present

## 2023-10-30 HISTORY — DX: Cardiac murmur, unspecified: R01.1

## 2023-10-30 MED ORDER — KETOROLAC TROMETHAMINE 0.5 % OP SOLN: 1.0000 [drp] | Freq: Four times a day (QID) | OPHTHALMIC | 0 refills | Status: AC

## 2023-10-30 MED ORDER — OLOPATADINE HCL 0.2 % OP SOLN: 1.0000 [drp] | Freq: Every day | OPHTHALMIC | 1 refills | Status: AC

## 2023-10-30 NOTE — ED Provider Notes (Signed)
 [facility] Provider Note    Event Date/Time   First MD Initiated Contact with Patient 10/30/23 1619     (approximate)   History   Eye Pain   HPI  James Mclaughlin is a 23 y.o. male  with history of asthma and as listed in EMR presents to the emergency department for treatment and evaluation of left eye swelling and redness that was present upon awakening this morning.  He states that he took a train to [facility] yesterday and there was a lot of pollen in the air.  Left eye has been red, irritated, itchy, and has had clear watery drainage since then.  He noticed some swelling this morning but that has improved with cool compress.      Physical Exam   Triage Vital Signs: ED Triage Vitals  Encounter Vitals Group     BP 10/30/23 1543 134/75     Systolic BP Percentile --      Diastolic BP Percentile --      Pulse Rate 10/30/23 1543 85     Resp 10/30/23 1543 17     Temp 10/30/23 1542 98.7 F (37.1 C)     Temp Source 10/30/23 1542 Oral     SpO2 10/30/23 1543 97 %     Weight 10/30/23 1542 180 lb (81.6 kg)     Height 10/30/23 1542 5\' 8"  (1.727 m)     Head Circumference --      Peak Flow --      Pain Score 10/30/23 1542 8     Pain Loc --      Pain Education --      Exclude from Growth Chart --     Most recent vital signs: Vitals:   10/30/23 1542 10/30/23 1543  BP:  134/75  Pulse:  85  Resp:  17  Temp: 98.7 F (37.1 C)   SpO2:  97%    General: Awake, no distress.  CV:  Good peripheral perfusion.  Resp:  Normal effort.  Abd:  No distention.  Other:  Conjunctiva of the left eye mildly injected and erythematous.  Clear watery drainage noted.   ED Results / Procedures / Treatments   Labs (all labs ordered are listed, but only abnormal results are displayed) Labs Reviewed - No data to display   EKG  Not indicated.   RADIOLOGY  Image and radiology report reviewed and interpreted by me. Radiology report consistent with the same.  Not  indicated.  PROCEDURES:  Critical Care performed: No  Procedures   MEDICATIONS ORDERED IN ED:  Medications - No data to display   IMPRESSION / MDM / ASSESSMENT AND PLAN / ED COURSE   I have reviewed the triage note.  Differential diagnosis includes, but is not limited to, allergic conjunctivitis, viral conjunctivitis, bacterial conjunctivitis  Patient's presentation is most consistent with acute, uncomplicated illness.  23 year old male presenting to the emergency department for evaluation of left eye irritation that started yesterday after being exposed to pollen in the air.  See HPI for further details.  Upon exam, he does have some mild injection and erythema of the conjunctiva with clear, watery drainage.  No edema of the upper or lower eyelid.  Plan will be to treat him with Pataday and Acular.  Work note for today provided as well.  He will be instructed to follow-up with primary care, ophthalmology, or return to the emergency department if her symptoms are not improving over the next few days or  if symptoms change or worsen.      FINAL CLINICAL IMPRESSION(S) / ED DIAGNOSES   Final diagnoses:  Allergic conjunctivitis of left eye     Rx / DC Orders   ED Discharge Orders          Ordered    ketorolac (ACULAR) 0.5 % ophthalmic solution  4 times daily        10/30/23 1654    Olopatadine HCl 0.2 % SOLN  Daily        10/30/23 1654             Note:  This document was prepared using Dragon voice recognition software and may include unintentional dictation errors.   Chinita Pester, FNP 10/30/23 1654    Janith Lima, MD 10/30/23 [phone]

## 2023-10-30 NOTE — ED Notes (Signed)
 Pt states he woke up with left eye swollen and hard to open. Pt states eye was crusted over with redness.  States only the left eye. Pt complains that vision is a little blurry in the left eye.

## 2023-10-30 NOTE — ED Triage Notes (Signed)
 Patient to ED via POV for left eye swelling and redness. States it was like that when he woke up this AM.

## 2024-03-10 ENCOUNTER — Other Ambulatory Visit: Payer: Self-pay

## 2024-03-10 ENCOUNTER — Emergency Department
Admission: EM | Admit: 2024-03-10 | Discharge: 2024-03-11 | Disposition: A | Discharge status: Home / Self Care | Attending: Emergency Medicine | Admitting: Emergency Medicine

## 2024-03-10 ENCOUNTER — Emergency Department

## 2024-03-10 DIAGNOSIS — S0990XA Unspecified injury of head, initial encounter: Secondary | ICD-10-CM | POA: Diagnosis present

## 2024-03-10 DIAGNOSIS — W19XXXA Unspecified fall, initial encounter: Secondary | ICD-10-CM

## 2024-03-10 DIAGNOSIS — S060X1A Concussion with loss of consciousness of 30 minutes or less, initial encounter: Secondary | ICD-10-CM | POA: Diagnosis not present

## 2024-03-10 DIAGNOSIS — W01198A Fall on same level from slipping, tripping and stumbling with subsequent striking against other object, initial encounter: Secondary | ICD-10-CM | POA: Insufficient documentation

## 2024-03-10 LAB — BASIC METABOLIC PANEL WITH GFR
Anion gap: 10 (ref 5–15)
BUN: 9 mg/dL (ref 6–20)
CO2: 27 mmol/L (ref 22–32)
Calcium: 8.9 mg/dL (ref 8.9–10.3)
Chloride: 101 mmol/L (ref 98–111)
Creatinine, Ser: 1.13 mg/dL (ref 0.61–1.24)
GFR, Estimated: >60 mL/min (ref >60)
Glucose, Bld: 98 mg/dL (ref 70–99)
Potassium: 3.6 mmol/L (ref 3.5–5.1)
Sodium: 138 mmol/L (ref 135–145)

## 2024-03-10 LAB — CBC WITH DIFFERENTIAL/PLATELET
Abs Immature Granulocytes: 0.03 K/uL (ref 0.00–0.07)
Basophils Absolute: 0.1 K/uL (ref 0.0–0.1)
Basophils Relative: 1 %
Eosinophils Absolute: 0.1 K/uL (ref 0.0–0.5)
Eosinophils Relative: 1 %
HCT: 44.2 % (ref 39.0–52.0)
Hemoglobin: 15.1 g/dL (ref 13.0–17.0)
Immature Granulocytes: 0 %
Lymphocytes Relative: 34 %
Lymphs Abs: 2.7 K/uL (ref 0.7–4.0)
MCH: 29.5 pg (ref 26.0–34.0)
MCHC: 34.2 g/dL (ref 30.0–36.0)
MCV: 86.3 fL (ref 80.0–100.0)
Monocytes Absolute: 0.5 K/uL (ref 0.1–1.0)
Monocytes Relative: 7 %
Neutro Abs: 4.6 K/uL (ref 1.7–7.7)
Neutrophils Relative %: 57 %
Platelets: 248 K/uL (ref 150–400)
RBC: 5.12 MIL/uL (ref 4.22–5.81)
RDW: 12.1 % (ref 11.5–15.5)
WBC: 8 K/uL (ref 4.0–10.5)
nRBC: 0 % (ref 0.0–0.2)

## 2024-03-10 MED ORDER — BUTALBITAL-APAP-CAFFEINE 50-325-40 MG PO TABS
2.0000 | ORAL_TABLET | Freq: Once | ORAL | Status: AC
  Administered 2024-03-10: 2 via ORAL
  Filled 2024-03-10: qty 2

## 2024-03-10 MED ORDER — LACTATED RINGERS IV BOLUS
1000.0000 mL | Freq: Once | INTRAVENOUS | Status: AC
  Administered 2024-03-10: 1000 mL via INTRAVENOUS

## 2024-03-10 MED ORDER — KETOROLAC TROMETHAMINE 30 MG/ML IJ SOLN
15.0000 mg | Freq: Once | INTRAMUSCULAR | Status: AC
  Administered 2024-03-10: 15 mg via INTRAVENOUS
  Filled 2024-03-10: qty 1

## 2024-03-10 MED ORDER — ONDANSETRON HCL 4 MG/2ML IJ SOLN
4.0000 mg | Freq: Once | INTRAMUSCULAR | Status: AC
  Administered 2024-03-10: 4 mg via INTRAVENOUS
  Filled 2024-03-10: qty 2

## 2024-03-10 NOTE — Discharge Instructions (Addendum)
 Please take Tylenol  and ibuprofen /Advil  for your pain.  It is safe to take them together, or to alternate them every few hours.  Take up to 1000mg  of Tylenol  at a time, up to 4 times per day.  Do not take more than 4000 mg of Tylenol  in 24 hours.  For ibuprofen , take 400-600 mg, 3 - 4 times per day.  In general for concussion management, any sort of stimulation, lights, screens, work/focus that causes you to feel worse -then please avoid these activities until you feel better.  You would likely sleep a little bit more than normal as your brain is healing, this is normal.  You do not need to be woken up to be checked on -sleep will help you feel better  If you have other episodes of passing out, seizures or other concerns then please return to the ED

## 2024-03-10 NOTE — ED Provider Notes (Signed)
 [facility] Provider Note    Event Date/Time   First MD Initiated Contact with Patient 03/10/24 2106     (approximate)   History   Head Injury   HPI  James Mclaughlin is a 23 y.o. male who presents to the ED for evaluation of Head Injury   Generally healthy 23 year old presents to the ED after reported head injury with possible syncope.  EMS reports that patient was briskly climbing a tight staircase at home, interior steps that were hardwood, not carpeted or padded.  Reportedly tripped while going up the steps, fell backwards and struck his head causing possible syncope.   On arrival to the ED patient is somnolent with a nonfocal exam and history is limited.  On subsequent evaluations when he is back to baseline, he reports feeling much better.  No preceding illnesses or concerns.  He recalls going up some steps and thinks that he just tripped and fell.  No additional relevant history   Physical Exam   Triage Vital Signs: ED Triage Vitals  Encounter Vitals Group     BP 03/10/24 2107 (!) 159/100     Girls Systolic BP Percentile --      Girls Diastolic BP Percentile --      Boys Systolic BP Percentile --      Boys Diastolic BP Percentile --      Pulse Rate 03/10/24 2107 91     Resp 03/10/24 2107 16     Temp 03/10/24 2109 98.2 F (36.8 C)     Temp Source 03/10/24 2109 Oral     SpO2 03/10/24 2107 100 %     Weight --      Height --      Head Circumference --      Peak Flow --      Pain Score 03/10/24 2108 10     Pain Loc --      Pain Education --      Exclude from Growth Chart --     Most recent vital signs: Vitals:   03/10/24 2200 03/10/24 2230  BP: 138/60 133/66  Pulse: 100 72  Resp: 17 13  Temp:    SpO2: 100% 100%    General: Awake, no distress.  CV:  Good peripheral perfusion.  Resp:  Normal effort.  Abd:  No distention.  MSK:  No deformity noted.  Palpation of all 4 extremities in the back without evidence of deformity,  tenderness or trauma.  No laceration to the scalp.  Some mild occipital tenderness Neuro:  No focal deficits appreciated. Other:     ED Results / Procedures / Treatments   Labs (all labs ordered are listed, but only abnormal results are displayed) Labs Reviewed  CBC WITH DIFFERENTIAL/PLATELET  BASIC METABOLIC PANEL WITH GFR    EKG Sinus rhythm with a rate of 87 bpm.  Normal axis and intervals without evidence of acute ischemia.  RADIOLOGY CT head interpreted by me without evidence of acute intracranial pathology CT cervical spine interpreted by me without evidence of fracture or dislocation  Official radiology report(s): CT Head Wo Contrast Result Date: 03/10/2024 CLINICAL DATA:  Head trauma, abnormal mental status (Age 68-64y); neck pain EXAM: CT HEAD WITHOUT CONTRAST CT CERVICAL SPINE WITHOUT CONTRAST TECHNIQUE: Multidetector CT imaging of the head and cervical spine was performed following the standard protocol without intravenous contrast. Multiplanar CT image reconstructions of the cervical spine were also generated. RADIATION DOSE REDUCTION: This exam was performed according to the departmental  dose-optimization program which includes automated exposure control, adjustment of the mA and/or kV according to patient size and/or use of iterative reconstruction technique. COMPARISON:  None Available. FINDINGS: CT HEAD FINDINGS Brain: Normal anatomic configuration. No abnormal intra or extra-axial mass lesion or fluid collection. No abnormal mass effect or midline shift. No evidence of acute intracranial hemorrhage or infarct. Ventricular size is normal. Cerebellum unremarkable. Vascular: Unremarkable Skull: Intact Sinuses/Orbits: Paranasal sinuses are clear. Orbits are unremarkable. Other: Mastoid air cells and middle ear cavities are clear. CT CERVICAL SPINE FINDINGS Alignment: Normal. Skull base and vertebrae: No acute fracture. No primary bone lesion or focal pathologic process. Soft  tissues and spinal canal: No prevertebral fluid or swelling. No visible canal hematoma. Disc levels: Intervertebral disc heights are preserved. Prevertebral soft tissues are not thickened on sagittal reformats. Spinal canal is widely patent. No significant neuroforaminal narrowing. Upper chest: Negative. Other: None IMPRESSION: 1. No acute intracranial abnormality. No calvarial fracture. 2. No acute fracture or listhesis of the cervical spine. Electronically Signed   By: Dorethia Molt M.D.   On: 03/10/2024 21:43   CT Cervical Spine Wo Contrast Result Date: 03/10/2024 CLINICAL DATA:  Head trauma, abnormal mental status (Age 64-64y); neck pain EXAM: CT HEAD WITHOUT CONTRAST CT CERVICAL SPINE WITHOUT CONTRAST TECHNIQUE: Multidetector CT imaging of the head and cervical spine was performed following the standard protocol without intravenous contrast. Multiplanar CT image reconstructions of the cervical spine were also generated. RADIATION DOSE REDUCTION: This exam was performed according to the departmental dose-optimization program which includes automated exposure control, adjustment of the mA and/or kV according to patient size and/or use of iterative reconstruction technique. COMPARISON:  None Available. FINDINGS: CT HEAD FINDINGS Brain: Normal anatomic configuration. No abnormal intra or extra-axial mass lesion or fluid collection. No abnormal mass effect or midline shift. No evidence of acute intracranial hemorrhage or infarct. Ventricular size is normal. Cerebellum unremarkable. Vascular: Unremarkable Skull: Intact Sinuses/Orbits: Paranasal sinuses are clear. Orbits are unremarkable. Other: Mastoid air cells and middle ear cavities are clear. CT CERVICAL SPINE FINDINGS Alignment: Normal. Skull base and vertebrae: No acute fracture. No primary bone lesion or focal pathologic process. Soft tissues and spinal canal: No prevertebral fluid or swelling. No visible canal hematoma. Disc levels: Intervertebral disc  heights are preserved. Prevertebral soft tissues are not thickened on sagittal reformats. Spinal canal is widely patent. No significant neuroforaminal narrowing. Upper chest: Negative. Other: None IMPRESSION: 1. No acute intracranial abnormality. No calvarial fracture. 2. No acute fracture or listhesis of the cervical spine. Electronically Signed   By: Dorethia Molt M.D.   On: 03/10/2024 21:43    PROCEDURES and INTERVENTIONS:  .1-3 Lead EKG Interpretation  Performed by: Claudene Rover, MD Authorized by: Claudene Rover, MD     Interpretation: normal     ECG rate:  74   ECG rate assessment: normal     Rhythm: sinus rhythm     Ectopy: none     Conduction: normal     Medications  ketorolac  (TORADOL ) 30 MG/ML injection 15 mg (15 mg Intravenous Given 03/10/24 2217)  butalbital -acetaminophen -caffeine  (FIORICET ) 50-325-40 MG per tablet 2 tablet (2 tablets Oral Given 03/10/24 2216)  lactated ringers  bolus 1,000 mL (1,000 mLs Intravenous New Bag/Given 03/10/24 2232)  ondansetron  (ZOFRAN ) injection 4 mg (4 mg Intravenous Given 03/10/24 2218)     IMPRESSION / MDM / ASSESSMENT AND PLAN / ED COURSE  I reviewed the triage vital signs and the nursing notes.  Differential diagnosis includes, but is not limited  to, syncope, seizure, ICH, skull fracture, cardiac dysrhythmia  {Patient presents with symptoms of an acute illness or injury that is potentially life-threatening.  Generally healthy 23 year old presents after head injury and possible syncope.  Nonfocal exam with resolving somnolence.  Returns back to baseline, GCS 15 with a normal exam and vitals.  Normal CBC and metabolic panel.  Reassuring imaging.  Suspect a degree of concussion.  His mother will come pick him up.  Provided work note.  Suitable for outpatient management.  Discussed concussion management and ED return precautions.  Clinical Course as of 03/10/24 2320  Sat Mar 10, 2024  2247 Reassessed.  Patient sitting upright in bed, playing on his  phone and looks much better than his initial presentation.  We discussed reassuring imaging.  We discussed likely concussion, we discussed management of concussion.  His mom is on the way. [DS]    Clinical Course User Index [DS] Claudene Rover, MD     FINAL CLINICAL IMPRESSION(S) / ED DIAGNOSES   Final diagnoses:  Injury of head, initial encounter  Fall, initial encounter  Concussion with loss of consciousness of 30 minutes or less, initial encounter     Rx / DC Orders   ED Discharge Orders     None        Note:  This document was prepared using Dragon voice recognition software and may include unintentional dictation errors.   Claudene Rover, MD 03/10/24 [phone]

## 2024-03-10 NOTE — ED Triage Notes (Signed)
 Pt presents via EMS c/o fall while walking up the stairs. ESM reports pt tripped over something. EMS reports + LOC, N/V. Pt following commands at this time. A&O x4. Presents in c-collar from EMS. C/o headache and neck pain.

## 2024-05-13 ENCOUNTER — Emergency Department
Admission: EM | Admit: 2024-05-13 | Discharge: 2024-05-13 | Disposition: A | Discharge status: Home / Self Care | Attending: Emergency Medicine | Admitting: Emergency Medicine

## 2024-05-13 ENCOUNTER — Other Ambulatory Visit: Payer: Self-pay

## 2024-05-13 DIAGNOSIS — J111 Influenza due to unidentified influenza virus with other respiratory manifestations: Secondary | ICD-10-CM

## 2024-05-13 DIAGNOSIS — R52 Pain, unspecified: Secondary | ICD-10-CM | POA: Insufficient documentation

## 2024-05-13 DIAGNOSIS — R1110 Vomiting, unspecified: Secondary | ICD-10-CM | POA: Insufficient documentation

## 2024-05-13 DIAGNOSIS — R059 Cough, unspecified: Secondary | ICD-10-CM | POA: Diagnosis present

## 2024-05-13 LAB — RESP PANEL BY RT-PCR (RSV, FLU A&B, COVID)  RVPGX2
Influenza A by PCR: NEGATIVE
Influenza B by PCR: NEGATIVE
Resp Syncytial Virus by PCR: NEGATIVE
SARS Coronavirus 2 by RT PCR: NEGATIVE

## 2024-05-13 MED ORDER — ONDANSETRON 4 MG PO TBDP
4.0000 mg | ORAL_TABLET | Freq: Once | ORAL | Status: AC
  Administered 2024-05-13: 4 mg via ORAL
  Filled 2024-05-13: qty 1

## 2024-05-13 MED ORDER — ACETAMINOPHEN 325 MG PO TABS
650.0000 mg | ORAL_TABLET | Freq: Once | ORAL | Status: AC
  Administered 2024-05-13: 650 mg via ORAL
  Filled 2024-05-13: qty 2

## 2024-05-13 MED ORDER — ONDANSETRON HCL 4 MG PO TABS: 4.0000 mg | ORAL_TABLET | Freq: Three times a day (TID) | ORAL | 0 refills | Status: AC | PRN

## 2024-05-13 NOTE — ED Notes (Signed)
 See triage note  Presents with body aches for couple of days  Afebrile on arrival States he did have some vomiting PTA

## 2024-05-13 NOTE — ED Provider Notes (Signed)
 [place] EMERGENCY DEPARTMENT AT [facility] Provider Note   CSN: [id number] Arrival date & time: 05/13/24  1204     Patient presents with: Cough, Emesis, and Generalized Body Aches   James Mclaughlin is a 23 y.o. male.   Patient here for flulike symptoms with emesis and cough.  3 days of symptoms.  Denying any fevers at home.  No abdominal pain no chest pain no dysuria.  Notes he was not able to work due to the emesis.  Has still been able to keep down food and fluids.   Cough Associated symptoms: no chest pain, no chills, no fever, no headaches and no shortness of breath   Emesis Associated symptoms: cough   Associated symptoms: no abdominal pain, no chills, no diarrhea, no fever and no headaches        Prior to Admission medications   Medication Sig Start Date End Date Taking? Authorizing Provider  ondansetron  (ZOFRAN ) 4 MG tablet Take 1 tablet (4 mg total) by mouth every 8 (eight) hours as needed for nausea or vomiting. 05/13/24  Yes Kingston Mallick, PA-C  albuterol  (VENTOLIN  HFA) 108 (90 Base) MCG/ACT inhaler Inhale 1-2 puffs into the lungs every 6 (six) hours as needed for wheezing or shortness of breath. 09/06/22   [name], Rankin, MD  cetirizine  (ZYRTEC ) 10 MG tablet Take 1 tablet (10 mg total) by mouth daily. 11/19/22   Cuthriell, Dorn BIRCH, PA-C  dicyclomine  (BENTYL ) 10 MG capsule Take 1 capsule (10 mg total) by mouth 4 (four) times daily -  before meals and at bedtime. 11/19/22   Cuthriell, Dorn BIRCH, PA-C  famotidine  (PEPCID ) 20 MG tablet Take 1 tablet (20 mg total) by mouth 2 (two) times daily. 03/22/23   Viviann Pastor, MD  fluticasone  (FLONASE ) 50 MCG/ACT nasal spray Place 1 spray into both nostrils 2 (two) times daily. 11/19/22   Cuthriell, Dorn BIRCH, PA-C  hydrOXYzine  (ATARAX ) 25 MG tablet Take 1 tablet (25 mg total) by mouth 3 (three) times daily as needed for anxiety. 09/06/22   [name], Rankin, MD  ibuprofen  (ADVIL ) 600 MG tablet Take 1 tablet (600 mg  total) by mouth every 6 (six) hours as needed for moderate pain, fever or headache. 09/16/22   Charlene Debby BROCKS, PA-C  ketorolac  (ACULAR ) 0.5 % ophthalmic solution Place 1 drop into the left eye 4 (four) times daily. 10/30/23   Triplett, Cari B, FNP  loperamide  (IMODIUM  A-D) 2 MG tablet Take 1 tablet (2 mg total) by mouth 4 (four) times daily as needed. 11/19/22   Cuthriell, Dorn BIRCH, PA-C  naproxen  (NAPROSYN ) 500 MG tablet Take 1 tablet (500 mg total) by mouth 2 (two) times daily with a meal. 03/22/23   Viviann Pastor, MD  nicotine  polacrilex (NICORETTE ) 4 MG gum Take 1 each (4 mg total) by mouth as needed for smoking cessation (Last gum dose to be given no later than 6:30 PM to avoid difficulties sleeping). Patient not taking: Reported on 10/12/2022 09/06/22   Johny Rankin, MD  Olopatadine  HCl 0.2 % SOLN Apply 1 drop to eye daily. 10/30/23   Triplett, Cari B, FNP  promethazine -dextromethorphan (PROMETHAZINE -DM) 6.25-15 MG/5ML syrup Take 5 mLs by mouth 4 (four) times daily as needed for cough. Patient not taking: Reported on 10/12/2022 09/16/22   Charlene Debby BROCKS, PA-C  sertraline  (ZOLOFT ) 50 MG tablet Take 1 tablet (50 mg total) by mouth daily. 09/07/22 10/07/22  Johny Rankin, MD    Allergies: Bee pollen and Other    Review of Systems  Constitutional:  Negative for chills and fever.  HENT:  Negative for congestion.   Respiratory:  Positive for cough. Negative for shortness of breath.   Cardiovascular:  Negative for chest pain.  Gastrointestinal:  Positive for nausea and vomiting. Negative for abdominal pain and diarrhea.  Genitourinary:  Negative for dysuria.  Neurological:  Negative for weakness, numbness and headaches.    Updated Vital Signs BP (!) 131/52 (BP Location: Right Arm)   Pulse 89   Temp 98.5 F (36.9 C) (Oral)   Resp 20   Ht 5' 9 (1.753 m)   Wt 83.9 kg   SpO2 100%   BMI 27.32 kg/m   Physical Exam Vitals reviewed.  Constitutional:      Appearance: Normal  appearance.  HENT:     Head: Normocephalic and atraumatic.     Nose: Nose normal.     Mouth/Throat:     Mouth: Mucous membranes are moist.     Pharynx: No oropharyngeal exudate.  Cardiovascular:     Pulses: Normal pulses.  Pulmonary:     Effort: Pulmonary effort is normal.  Abdominal:     Tenderness: There is no abdominal tenderness. There is no guarding.  Musculoskeletal:     Cervical back: Normal range of motion.  Neurological:     Mental Status: He is alert and oriented to person, place, and time. Mental status is at baseline.  Psychiatric:        Mood and Affect: Mood normal.        Behavior: Behavior normal.     (all labs ordered are listed, but only abnormal results are displayed) Labs Reviewed  RESP PANEL BY RT-PCR (RSV, FLU A&B, COVID)  [id number]    EKG: None  Radiology: No results found.   Procedures   Medications Ordered in the ED  ondansetron  (ZOFRAN -ODT) disintegrating tablet 4 mg (4 mg Oral Given 05/13/24 1229)  acetaminophen  (TYLENOL ) tablet 650 mg (650 mg Oral Given 05/13/24 1229)                                    Medical Decision Making Patient here for cough emesis, flulike symptoms for 3 days now.  Hemodynamically stable afebrile he is overall nontoxic well-appearing no acute abdominal tenderness on exam do not suspect intra-abdominal pathology.  Will check a viral swab give antiemetics and p.o. challenge.  Negative viral swab.  Tolerating oral.  Will send in antiemetics recommend close follow-up outpatient I suspect viral etiology.   Risk OTC drugs. Prescription drug management.        Final diagnoses:  Vomiting, unspecified vomiting type, unspecified whether nausea present  Influenza-like illness    ED Discharge Orders          Ordered    ondansetron  (ZOFRAN ) 4 MG tablet  Every 8 hours PRN        05/13/24 1239               Kingston Mallick, PA-C 05/13/24 1259    Floy Roberts, MD 05/13/24 1315

## 2024-05-13 NOTE — Discharge Instructions (Addendum)
 Please take Tylenol  650 mg 4 times daily and/or ibuprofen 600 mg 3 times daily with food.  This will help with pain and fever. Ensure you get plenty of fluids. Use Mucinex for any sinus congestion.  Follow-up with primary care for further evaluation in the next few days.  Return here for new or worse symptoms including difficulty breathing, severe pain, intractable vomiting

## 2024-05-13 NOTE — ED Triage Notes (Signed)
 Pt to ED for body aches, emesis (3 times) and cough since yesterday. No acute distress, texting on phone.
